# Patient Record
Sex: Female | Born: 1955 | Race: Black or African American | Hispanic: No | Marital: Single | Smoking: Former smoker
Health system: Southern US, Community
[De-identification: ages and names within clinical notes are randomized; demographics above are authoritative.]

## PROBLEM LIST (undated history)

## (undated) DIAGNOSIS — D649 Anemia, unspecified: Secondary | ICD-10-CM

## (undated) DIAGNOSIS — I10 Essential (primary) hypertension: Secondary | ICD-10-CM

## (undated) DIAGNOSIS — E785 Hyperlipidemia, unspecified: Secondary | ICD-10-CM

## (undated) HISTORY — DX: Anemia, unspecified: D64.9

## (undated) HISTORY — PX: APPENDECTOMY: SHX54

## (undated) HISTORY — DX: Hyperlipidemia, unspecified: E78.5

## (undated) HISTORY — DX: Essential (primary) hypertension: I10

---

## 1997-10-02 ENCOUNTER — Ambulatory Visit (HOSPITAL_COMMUNITY): Admission: RE | Admit: 1997-10-02 | Discharge: 1997-10-02 | Payer: Self-pay | Admitting: Obstetrics and Gynecology

## 1997-10-18 ENCOUNTER — Ambulatory Visit: Admission: RE | Admit: 1997-10-18 | Discharge: 1997-10-18 | Payer: Self-pay | Admitting: Obstetrics and Gynecology

## 1998-12-10 ENCOUNTER — Other Ambulatory Visit: Admission: RE | Admit: 1998-12-10 | Discharge: 1998-12-10 | Payer: Self-pay | Admitting: Obstetrics and Gynecology

## 2000-11-22 ENCOUNTER — Other Ambulatory Visit: Admission: RE | Admit: 2000-11-22 | Discharge: 2000-11-22 | Payer: Self-pay | Admitting: Obstetrics and Gynecology

## 2001-02-02 ENCOUNTER — Encounter (INDEPENDENT_AMBULATORY_CARE_PROVIDER_SITE_OTHER): Payer: Self-pay | Admitting: *Deleted

## 2001-02-02 ENCOUNTER — Ambulatory Visit (HOSPITAL_BASED_OUTPATIENT_CLINIC_OR_DEPARTMENT_OTHER): Admission: RE | Admit: 2001-02-02 | Discharge: 2001-02-02 | Payer: Self-pay | Admitting: Surgery

## 2003-01-31 ENCOUNTER — Other Ambulatory Visit: Admission: RE | Admit: 2003-01-31 | Discharge: 2003-01-31 | Payer: Self-pay | Admitting: Internal Medicine

## 2003-12-10 ENCOUNTER — Other Ambulatory Visit: Admission: RE | Admit: 2003-12-10 | Discharge: 2003-12-10 | Payer: Self-pay | Admitting: Family Medicine

## 2005-03-22 ENCOUNTER — Other Ambulatory Visit: Admission: RE | Admit: 2005-03-22 | Discharge: 2005-03-22 | Payer: Self-pay | Admitting: Internal Medicine

## 2006-04-21 ENCOUNTER — Other Ambulatory Visit: Admission: RE | Admit: 2006-04-21 | Discharge: 2006-04-21 | Payer: Self-pay | Admitting: *Deleted

## 2007-04-10 ENCOUNTER — Other Ambulatory Visit: Admission: RE | Admit: 2007-04-10 | Discharge: 2007-04-10 | Payer: Self-pay | Admitting: Family Medicine

## 2007-10-03 ENCOUNTER — Encounter: Admission: RE | Admit: 2007-10-03 | Discharge: 2007-10-03 | Payer: Self-pay | Admitting: Family Medicine

## 2007-10-19 ENCOUNTER — Inpatient Hospital Stay (HOSPITAL_COMMUNITY): Admission: RE | Admit: 2007-10-19 | Discharge: 2007-10-22 | Payer: Self-pay | Admitting: Obstetrics and Gynecology

## 2007-10-19 ENCOUNTER — Encounter (INDEPENDENT_AMBULATORY_CARE_PROVIDER_SITE_OTHER): Payer: Self-pay | Admitting: Obstetrics and Gynecology

## 2007-11-08 ENCOUNTER — Inpatient Hospital Stay (HOSPITAL_COMMUNITY): Admission: EM | Admit: 2007-11-08 | Discharge: 2008-01-24 | Payer: Self-pay | Admitting: Emergency Medicine

## 2007-11-08 ENCOUNTER — Encounter (INDEPENDENT_AMBULATORY_CARE_PROVIDER_SITE_OTHER): Payer: Self-pay | Admitting: General Surgery

## 2007-11-08 ENCOUNTER — Ambulatory Visit: Payer: Self-pay | Admitting: Internal Medicine

## 2007-11-09 ENCOUNTER — Encounter: Payer: Self-pay | Admitting: Family Medicine

## 2007-11-25 ENCOUNTER — Ambulatory Visit: Payer: Self-pay | Admitting: *Deleted

## 2007-11-25 ENCOUNTER — Encounter (INDEPENDENT_AMBULATORY_CARE_PROVIDER_SITE_OTHER): Payer: Self-pay | Admitting: Internal Medicine

## 2007-12-11 ENCOUNTER — Ambulatory Visit: Payer: Self-pay | Admitting: Infectious Diseases

## 2008-02-20 ENCOUNTER — Encounter: Admission: RE | Admit: 2008-02-20 | Discharge: 2008-02-20 | Payer: Self-pay | Admitting: General Surgery

## 2008-06-13 ENCOUNTER — Other Ambulatory Visit: Admission: RE | Admit: 2008-06-13 | Discharge: 2008-06-13 | Payer: Self-pay | Admitting: Family Medicine

## 2008-12-15 IMAGING — CT CT PELVIS W/ CM
2 of 5 series · 13 of 32 positions shown, 18 images · IV contrast (water/omni  & 100 ML OMNI 300)
Comparison: CT 12/05/2007

CT ABDOMEN

CLINICAL DATA: Abdominal sepsis.  Evaluate for right lower
quadrant fistula.  The history of exploratory laparotomy for bowel
perforation and appendix/cecum.  Status post desmoid removal.  Low
abdominal pain..

CT ABDOMEN AND PELVIS WITH CONTRAST
TECHNIQUE: Multidetector CT imaging of the abdomen and pelvis was
performed using the standard protocol following bolus
administration of intravenous contrast.
Contrast: 100 ml 9mnipaque-1DD IV.  Oral contrast media.

[Series 2: routine abdomen · axial · 0.98mm/px · z∈[-403,-23]mm · 6 of 108 slices shown, 11 images]
[im 16/108  soft-tissue]
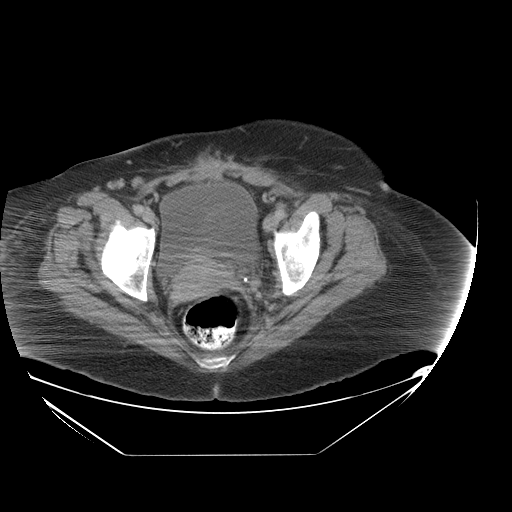
[im 16/108  bone]
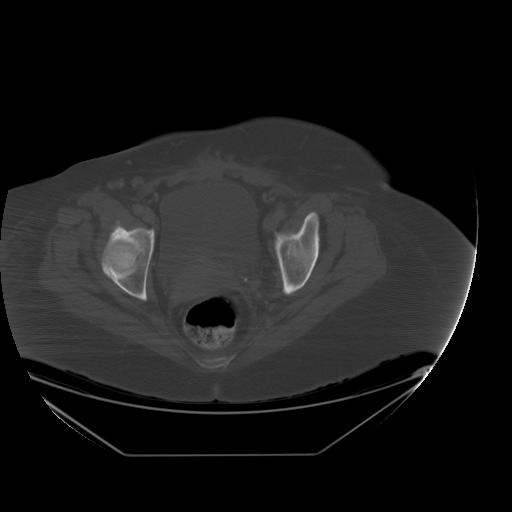
[im 31/108  soft-tissue]
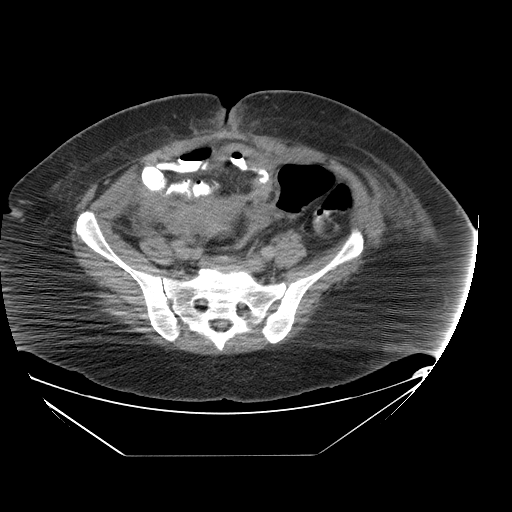
[im 46/108  soft-tissue]
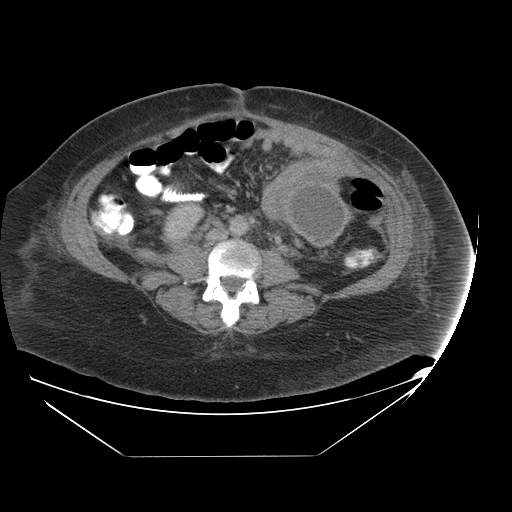
[im 46/108  lung]
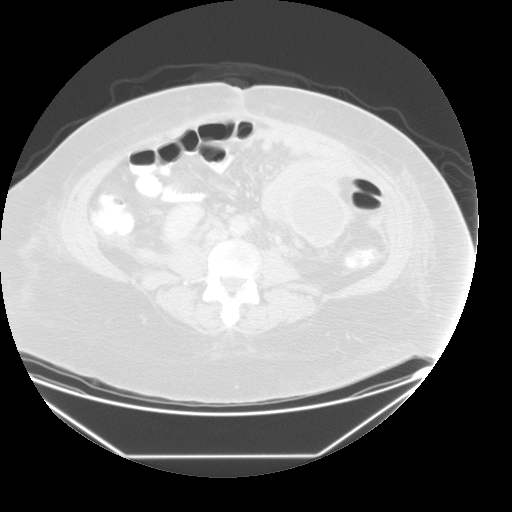
[im 62/108  soft-tissue]
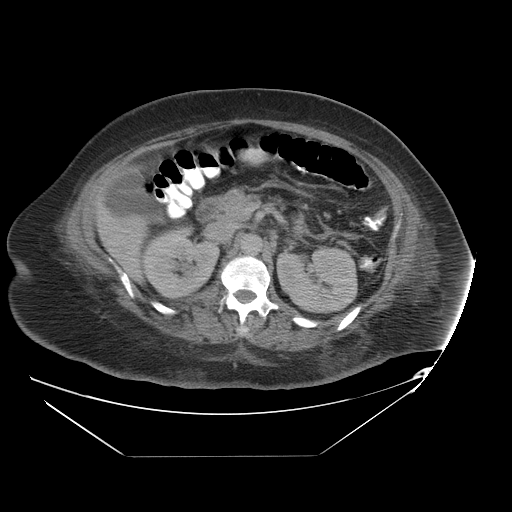
[im 62/108  lung]
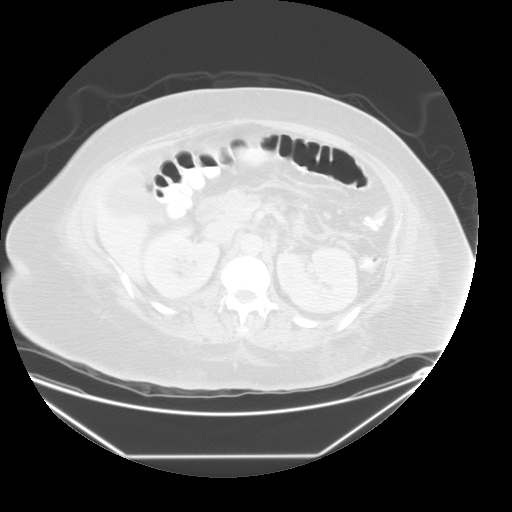
[im 77/108  soft-tissue]
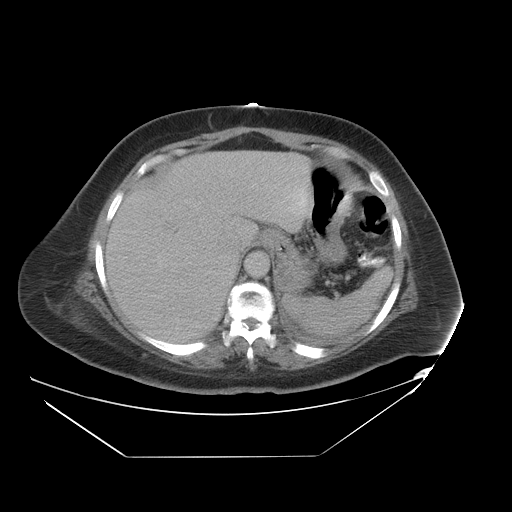
[im 77/108  lung]
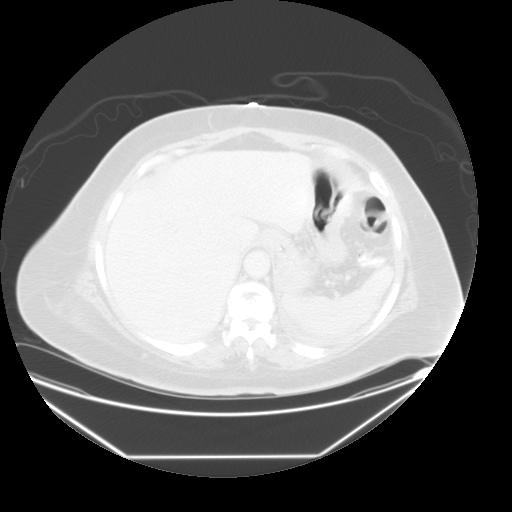
[im 92/108  soft-tissue]
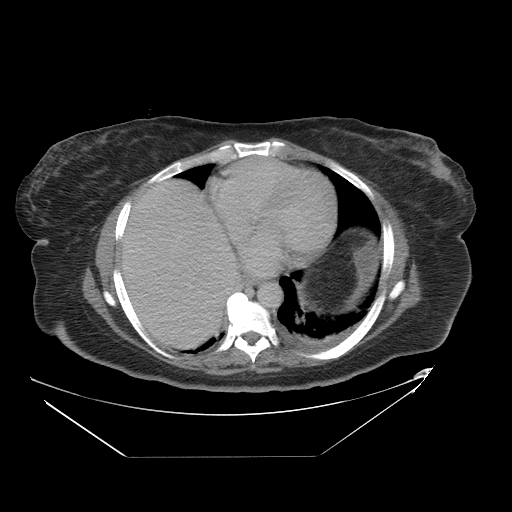
[im 92/108  lung]
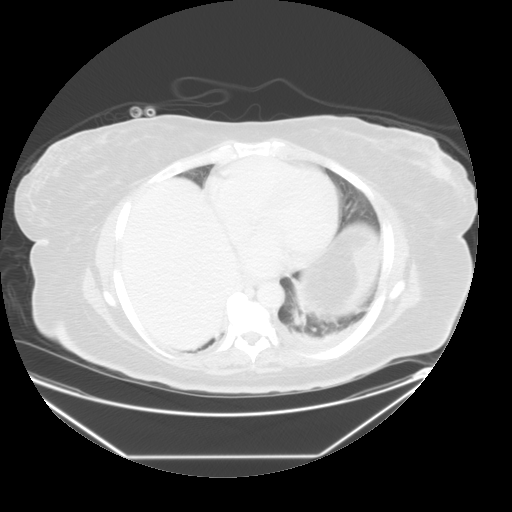

[Series 400: reformatted · sagittal · 0.90mm/px · 7 of 141 slices shown]
[im 15/141  soft-tissue]
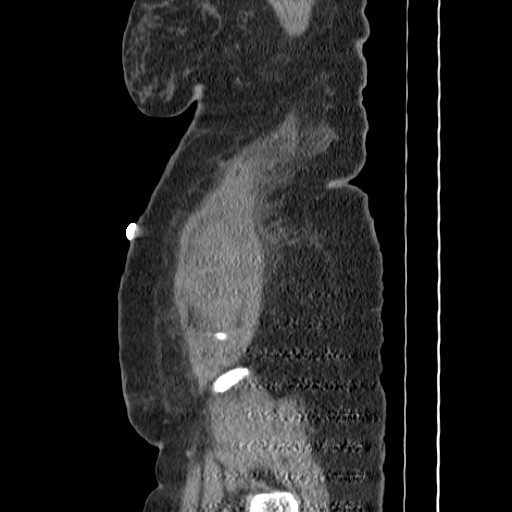
[im 29/141  soft-tissue]
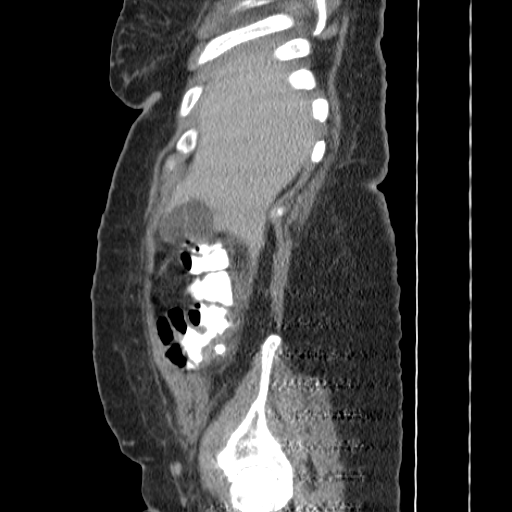
[im 43/141  soft-tissue]
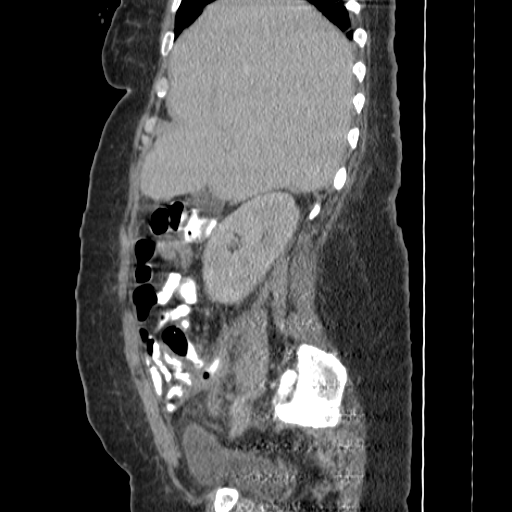
[im 57/141  soft-tissue]
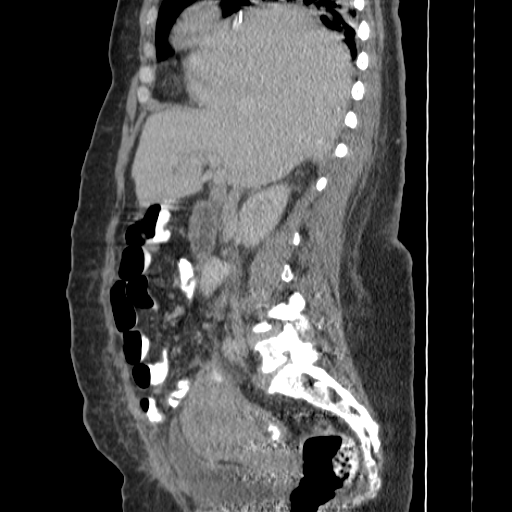
[im 85/141  soft-tissue]
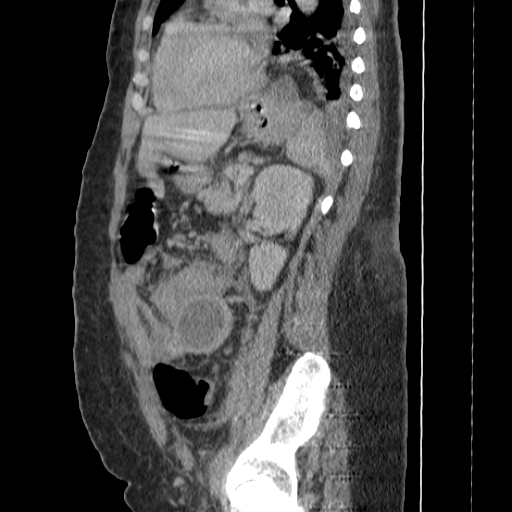
[im 99/141  soft-tissue]
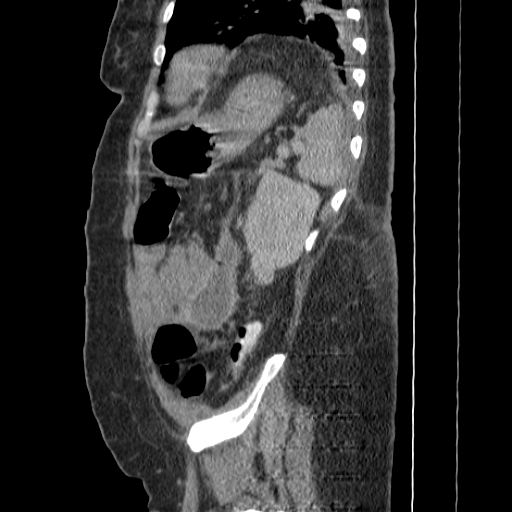
[im 113/141  soft-tissue]
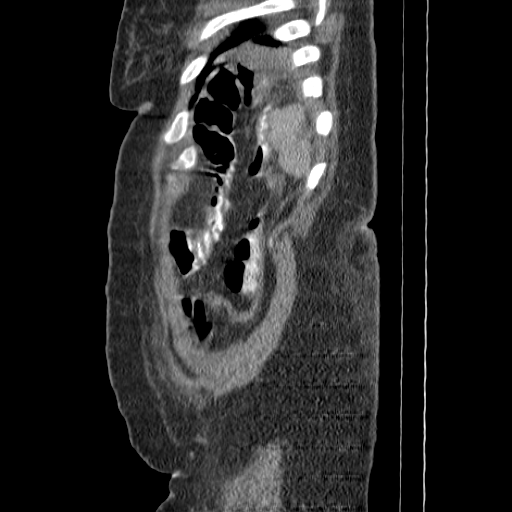

[13 of 32 positions shown; findings below may reference images not displayed]

FINDINGS: Atelectatic densities at the posterior lung bases.
Small left pleural effusion.  Unremarkable appearance of the liver,
spleen, pancreas.  Nephrostomy tube on the right as been removed
since prior exam.  Minimal dilatation of the collecting systems.
Reactive soft tissue thickening right posterior pararenal space
appears less impressive now.  Posterior abdominal catheter on the
right to extends into the a minimal residual fluid collection with
the soft tissue density appearing represent the coapted walls of
the previously noted collection.  There is some contrast material
within the collection extending from the region of the base of the
cecum.  There are is a thick-walled fluid collection/mass in the
left lower quadrant (images 55 - 70).  The collection measures
x 6.8 cm.  It is noted within the peritoneal cavity of the left
lower quadrant.
IMPRESSION: Decrease in soft tissues thickening of the right posterior
pararenal space.  Decrease in size of right posterior abdominal
fluid collection.  There is a fluid collection in the left lower
quadrant peritoneal cavity.  It is smaller and more well-defined
now..

CT PELVIS
FINDINGS: There is a 3.3-7.6 cm fluid collection containing
contrast material in the lower true pelvis.  It is located just
posterior the uterus and anterior to the rectum.  The collection is
smaller than noted on the prior exam.
IMPRESSION: Marked decrease in size of previously noted fluid
collection/hematoma in the pelvis.  The collection is smaller now
and contains contrast material.

## 2008-12-17 IMAGING — CR DG CHEST 1V PORT
1 series · 1 of 1 positions shown · non-contrast
Comparison: 12/19/2007

CLINICAL DATA: PICC placement.

PORTABLE CHEST - 1 VIEW

[view not recorded]
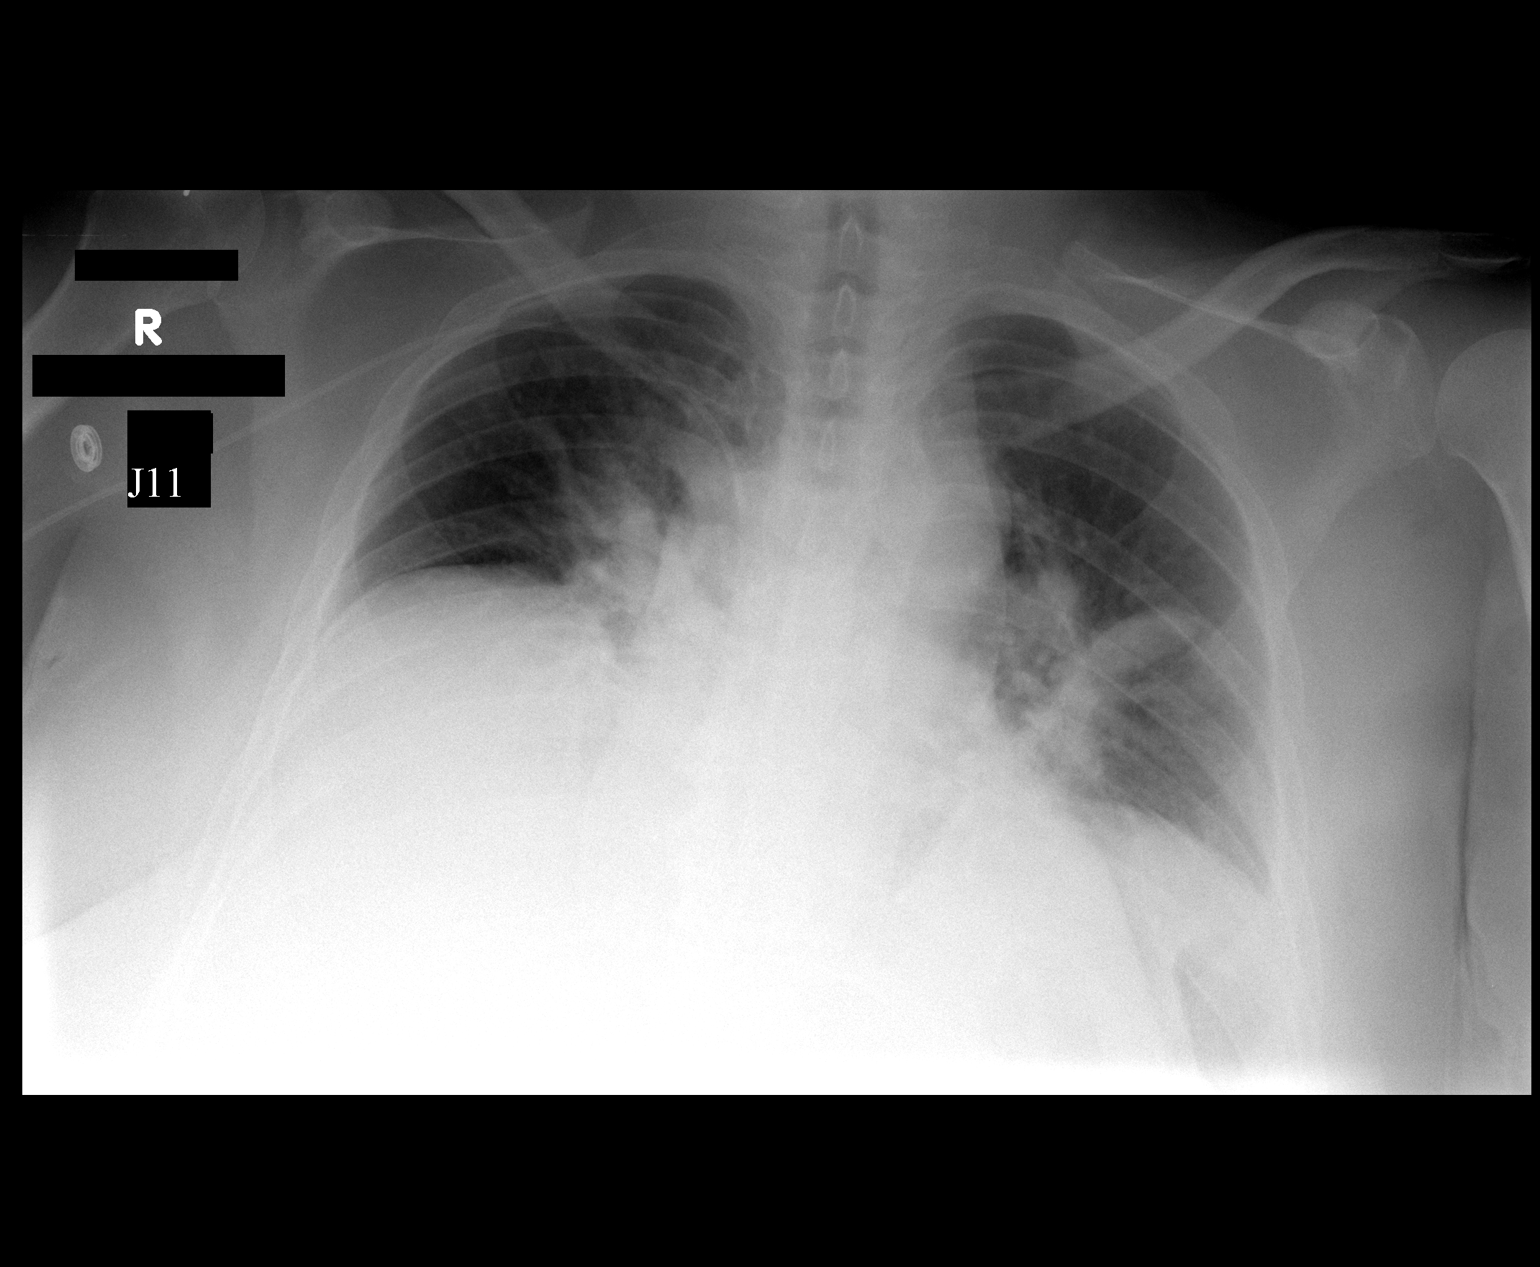

[1 of 1 positions shown; findings below may reference images not displayed]

FINDINGS: Right PICC tip projects over the lower SVC or SVC/RA
junction.  Lungs are very low in volume with perihilar and
bibasilar atelectasis.
IMPRESSION: Perihilar and bibasilar atelectasis.

## 2010-07-28 NOTE — Op Note (Signed)
[name], [name]                 ACCOUNT NO.:  [id number]   MEDICAL RECORD NO.:  [id number]          PATIENT TYPE:  INP   LOCATION:  2105                         FACILITY:  [facility]   PHYSICIAN:  Ollen Gross. Vernell Morgans, M.D. DATE OF BIRTH:  [birth year]   DATE OF PROCEDURE:  11/08/2007  DATE OF DISCHARGE:  11/07/2007                               OPERATIVE REPORT   PREOPERATIVE DIAGNOSES:  1. Free intra-abdominal air.  2. Bowel perforation.  3. Sepsis.   POSTOPERATIVE DIAGNOSES:  1. Free intra-abdominal air.  2. Bowel perforation.  3. Sepsis.  4. Perforated appendicitis.   PROCEDURES:  1. Exploratory laparotomy.  2. Lysis of adhesions.  3. Drainage of intra-abdominal abscesses.  4. Appendectomy.   SURGEON:  Ollen Gross. Vernell Morgans, MD   ASSISTANT:  Almond Lint, MD   ANESTHESIA:  General endotracheal.   PROCEDURE:  After informed consent was obtained, the patient was brought  to the operating and placed in a supine position on the operating table.  After adequate induction of general anesthesia, the patient's abdomen  was prepped with Betadine and draped in the usual sterile manner.  The  patient had undergone an open resection of a dermoid cyst from her right  ovary about 3 weeks ago.  She initially did well, but over the last  week, she has had increasing abdominal pain and CT scan performed today  showed free intra-abdominal air consistent with a bowel perforation.  A  midline incision was made with a #10 blade knife.  This incision was  carried down through the skin and subcutaneous tissue sharply with the  electrocautery until the linea alba was identified.  The linea alba was  incised.  Also with the electrocautery, the preperitoneal space was  probed bluntly with the hemostat until the peritoneum was opened.  Access was gained to the abdominal cavity.  The rest of the incision was  then opened under direct vision.  Upon entering the abdominal cavity,  the patient had a  significant amount of pus in the abdominal cavity.  A  lot of this was evacuated and cultures were taken.  The incision had to  be extended superiorly in order for [place] to be able to be visualize the  contents of the abdomen.  There were a lot of interloop abscesses and  loculations that were all broken up by blunt finger dissection.  The  small bowel was then freed up proximally and distally, although there  was inflammation and purulence throughout the abdomen, the most dense  inflammatory process was in the right lower quadrant around the  appendix.  The appendix was able to be identified and did appear to be  inflamed, although it was not clear given the amount of inflammation  throughout the abdomen whether this was the primary source.  The worse  density of the adhesions and inflammation appeared to be centered around  the appendix.  The right colon was mobilized by incising some of its  retroperitoneal attachment along the white line of Toldt.  This allowed  [place] to roll the cecum medially and  up into the wound.  Once we were able  to do this, we were able to break up some adhesions around the appendix  by finger dissection.  Near the base, the appendix was then opened  bluntly with a right-angle clamp.  A GIA-55 stapler was then placed  across the appendix near its base at this point, clamped, and fired  thereby dividing the base of  the appendix between staple lines.  The  rest of the mesoappendix was taken down sharply with the electrocautery.  A small bleeding vessel was controlled with the figure-of-eight 2-0 silk  stitch.  The colon was also examined.  The rectosigmoid area was  inflamed but seemed to be secondarily inflamed to the rest of the  process.  No other points of perforation were identified.  The abdomen  was then irrigated with copious amounts of saline.  A stab incision was  made in the right lateral abdominal wall with a #15 blade knife.  A  tonsil clamp was placed  through this opening into the abdominal cavity  and a 19-French round Blake drain was brought through this opening.  The  drain was laid along the right gutter and into the pelvis.  The inferior  portion of the incision was very dense with inflammatory change from the  timing of her old surgery and it was felt that at this point closing her  primarily might put her at risk for dehiscence.  Because of this, we  then closed her abdomen with 2 running #1 double-stranded loop PDS  sutures as well as four #5 Ethibond retention sutures.  Once the  abdominal cavity was closed and retention bridges were set, the wound  was then packed with moistened Kerlix and sterile dressings were  applied.  The patient tolerated the procedure well.  At the end of the  case, all needle, sponge, and instrument counts were correct.  The  patient was then taken back to the ICU and intubated for further  resuscitation measures.      Ollen Gross. Vernell Morgans, M.D.  Electronically Signed     PST/MEDQ  D:  11/09/2007  T:  11/09/2007  Job:  [id number]

## 2010-07-28 NOTE — Consult Note (Signed)
NAMELACHE, [name]                 ACCOUNT NO.:  [id number]   MEDICAL RECORD NO.:  [id number]          PATIENT TYPE:  INP   LOCATION:  2915                         FACILITY:  [facility]   PHYSICIAN:  Bertram Millard. Dahlstedt, M.D.DATE OF BIRTH:  [birth year]   DATE OF CONSULTATION:  12/19/2007  DATE OF DISCHARGE:                                 CONSULTATION   REASON FOR CONSULTATION:  History of right hydronephrosis.   BRIEF HISTORY:  This is a 55 year old female who was admitted in August  2009 and has been in the hospital for severe complications secondary to  perforated appendicitis.  She had intraabdominal sepsis.  She had a  percutaneous nephrostomy tube placed on approximately November 25, 2007, secondary to sepsis and mild hydronephrosis.  At the time of  percutaneous nephrostomy tube placement, antegrade nephrostogram was  performed showing her ureter patent all the way to the bladder.  At that  time, she grew out negative urine culture.  She had percutaneous  nephrostomy tube replaced secondary to poor drainage approximately 6  days later.  At that time, her ureter was patent as well on the  nephrostogram.   Urology has not been consulted for this patient until today.  The  question is whether she needs internalization of the stent or removal of  percutaneous nephrostomy tube.   I have had a chance to review the patient's CT scans during the  hospitalization as well as the approximately tube placement change.  There is very minimal hydronephrosis on an ultrasound done today of her  initial nephrostomy tube placement.  Her ureter is patent on  nephrostograms.   I spoke with the patient.  I feel it is worthwhile to plug her  nephrostomy tube tonight and over the next day or so perform a  nephrostogram.  If she develops pain in the kidney, I would recommend  rehooking the nephrostomy tube to drainage.  Otherwise, I will write an  order for x-ray to perform a nephrostogram an  eventual removal of  nephrostomy tube.      Bertram Millard. Dahlstedt, M.D.  Electronically Signed     SMD/MEDQ  D:  12/19/2007  T:  12/20/2007  Job:  [id number]

## 2010-07-28 NOTE — Consult Note (Signed)
NAMEJUNIE, AVILLA                 ACCOUNT NO.:  [id number]   MEDICAL RECORD NO.:  [id number]          PATIENT TYPE:  INP   LOCATION:  2105                         FACILITY:  [facility]   PHYSICIAN:  Gustavus Messing. Orlin Hilding, M.D.DATE OF BIRTH:  [birth year]   DATE OF CONSULTATION:  11/14/2007  DATE OF DISCHARGE:                                 CONSULTATION   REASON FOR CONSULTATION:  Pupillary asymmetry and abnormal CT,  questionable cerebral edema.   HISTORY OF PRESENT ILLNESS:  [name] is 55 year old African American  woman who was admitted on November 08, 2007, with abdominal pain and  mental status changes, which turned out to be due to shock from ruptured  viscus.  She went to surgery emergently and was found to have a ruptured  appendix and sepsis.  She underwent emergency surgery, but has had  multisystem failure.  Since then requiring continued ventilator support  and continued dialysis.  Mental status changes were described at  admission, blood pressures has been stable.  Neuro exam is not well,  described except today, the pupils were active and that she had exam  consistent with toxic metabolic encephalopathy.  On one occasion, she  was mentioned to be using all of her extremities.  She waxed and waned  since admission and then on November 12, 2007, continued to deteriorate  with low blood pressure and low hemoglobin.  Early this morning, she was  noted to have pupillary asymmetry and pupils were poorly reactive.  CT  scan of brain was obtained, which was interpreted showing some cerebral  edema.  Neurology was called.   The review of systems is not obtainable at this time as the patient is  intubated and comatose.  However, on admission she had back pain that  she described as dull.  She had had a recent surgery for ovarian cyst  just last month.  She does have chronic back pain as well.  On  admission, she had no neurological complaints.  She uses contact lenses,  otherwise she  has had a fairly unremarkable exam and history.  Her past  medical history significant for the ovarian dermoid cyst, which was  removed in the right ovary and 2 to 3 weeks ago, she has had excision of  the left breast mass presumably benign, two C-sections, and then an  exploratory laparoscopy for perforated appendix during this  hospitalization.   Her current medications are caspofungin, Xigris, heparin, Primaxin,  NovoLog, Versed, Protonix, Zosyn, and vancomycin.  She got fresh frozen  plasma and some packed red blood cells.  She has fentanyl drip.  She has  required Neo-Synephrine and Levophed, PrismaSate and sodium chloride.   ALLERGIES:  No known drug allergies.   SOCIAL HISTORY:  She is not married.  She has one son.  There is a  sister who is here at the bedside as well as niece and family friends.  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  No  tobacco, alcohol, or recreational drug use.   FAMILY HISTORY:  Noncontributory and unobtainable  at any rate.   OBJECTIVE:  On exam blood pressure currently 85/49 on pressor, heart  rates is 90.  Head is normocephalic and atraumatic.  Mental status:  There is no response to verbal stimulus.  She will open her eyes to  physical stimulus.  She also grimace to sternal rub, supraorbital  pressure, and nailbed pressure in the right upper extremity and both  lower extremities, but no response to left upper extremity.  Cranial  nerves:  She has a slightly conjugate gaze with pupils asymmetric right  at about 3 mm, nonreactive left 2 mm and nonreactive.  There is no blink  to threat.  She does have positive corneals bilaterally and able to  drive eyes to either side of midline with oculocephalic maneuver.  She  is breathing over the ventilator and does gag on suction.  Motor exam:  She has flaccid tone, no movement to pain, but does grimace to pain in  bilateral lower extremities and right upper extremity.  The CT  shows  question of mild effacement of the temporal sulci with normal sulci over  the convexities and actually dilated parietal sulci.  Normal ventricles  are perhaps too small for 55 and the basilar cisterns are normal.   LABS:  CBC shows a white blood cell count of 56.6, hemoglobin of 6.9,  hematocrit 19.3, platelets 243.  Sodium 137, potassium 3.9, chloride 95,  CO2 28, glucose 145, BUN 50, creatinine 6.89.  SGOT and SGPT are  elevated.  Calcium is 7.0 to the low.   IMPRESSION:  Mixed encephalopathy with hypovolemic shock, sepsis,  multiorgan failure.  CT scan is not that impressive for edema to me with  basilar cisterns and most cortical sulci preserved.  There is no  indication of herniation.  However, exam is concerning for midbrain  level dysfunction, whether ischemic or secondary to mass effect or  herniation is unclear.   RECOMMENDATIONS:  I see no role for pharmacologic intervention such as  mannitol or Decadron as there is no mass and nothing could be resected  that could help reverse this process.  I will follow her over time.  Recheck CT of the brain this evening.  Check an EEG.  Under the  circumstances, the prognosis is poor although the patient certainly is  not brain dead.  I discussed this with the patient's family.      Catherine A. Orlin Hilding, M.D.  Electronically Signed     CAW/MEDQ  D:  11/14/2007  T:  11/15/2007  Job:  [id number]

## 2010-07-28 NOTE — Procedures (Signed)
EEG NUMBER:  [id number].   This is a 55 year old woman who is suffering from hypovolemic shock and  sepsis due to perforated appendix.  The patient has had multisystem  failure including severe oliguric renal failure and ventilator-dependent  respiratory failure.  She was noted to have pupillary asymmetry with  worsening of her mental status.   MEDICATIONS:  Protonix, insulin, Primaxin, Versed, morphine, Levophed,  Normodyne, and vancomycin.   This was a portable 17-channel EEG, with one channel devoted to EKG,  utilizing the [employer] 10/20 lead placement system.  The patient  was described clinically as being unresponsive electrographically.  Her  conscious state is not well identified.  Photic stimulation was  performed.  Hyperventilation was not performed.  Background consists of  an admixture of theta, delta, and alpha activity with 5-7 Hz beta  predominating; occasional bouts of 8-10 Hz alpha are seen, but there is  never a well-organized, well-developed, normal-appearing alpha  background.  Occasional slowing to the delta range is seen and  occasional isolated delta waves are identified without clear  periodicity.  Occasional small sharp waves were also seen with shifting  focus.  No clear interhemispheric asymmetry is identified and no  definite epileptiform discharges are seen other than small sharp waves  described above.  Photic stimulation did not produce any significant  change in the background activity.  Hyperventilation was not performed.  The EKG monitor reveals a relatively regular rhythm with a rate of 90  beats per minute.   CONCLUSION:  Abnormal EEG demonstrating generalized slowing.  No  definite seizure activity or focal abnormalities are seen during the  course of today's recording.  This is certainly not a study consistent  with brain death but does indicate a moderately severe, diffuse neuronal  dysfunction that may be seen in toxic, metabolic, anoxic  encephalopathies including hypovolemic states and sepsis.  Clinical  correlation is recommended.      Catherine A. Orlin Hilding, M.D.  Electronically Signed     [id number]  D:  11/14/2007 18:46:02  T:  11/15/2007 07:35:23  Job #:  [id number]

## 2010-07-28 NOTE — Discharge Summary (Signed)
NAME:  [name], [name]                 ACCOUNT NO.:  [id number]   MEDICAL RECORD NO.:  [id number]          PATIENT TYPE:  INP   LOCATION:  5152                         FACILITY:  [facility]   PHYSICIAN:  Ollen Gross. Vernell Morgans, M.D. DATE OF BIRTH:  1955/04/14   DATE OF ADMISSION:  11/07/2007  DATE OF DISCHARGE:  01/24/2008                               DISCHARGE SUMMARY   ADMITTING PHYSICIAN:  Ollen Gross. Vernell Morgans, MD   DISCHARGING PHYSICIAN:  Dr. Freida Busman.   CONSULTANTS:  1. Felipa Evener, MD, Critical Care Medicine, November 08, 2007.  2. Dyke Maes, MD, Nephrology, November 09, 2007.  3. Gustavus Messing. Orlin Hilding, MD, Neurology, November 14, 2007.  4. Lucky Cowboy, MD, Ears, Nose, and Throat on November 26, 2007.  5. Bertram Millard. Dahlstedt, MD, Urology, on December 19, 2007.  6. Interventional Radiology, November 25, 2007.  7. Lacretia Leigh. Ninetta Lights, MD, with Infectious Disease.   CHIEF COMPLAINT/REASON FOR ADMISSION:  Ms. Peppel is a 55 year old  African American female patient 3 weeks post resection of dermoid cyst,  right ovary, did well initially, but over the past week had increasing  abdominal pain and low-grade fevers at home.  This was associated with  nausea, vomiting, and decreased level of consciousness.  She was brought  to the ER, where she was found to be quite toxic in appearance.  Temperature was 99.2, BP 84/49, and pulse 101.  White count 22,900.  Creatinine of 5.2.  On exam, lungs were clear, not tachypneic.  Heart  was regular with tachycardia.  Abdomen was diffusely tender with  guarding and peritoneal signs, well-healed midline abdominal incision.  CT scan of the abdomen was done, revealed significant amount of free  abdominal air and free anterior abdominal fluid consistent with the  bowel preparation.   The patient was admitted with following diagnoses:  1. Free air and free fluid secondary to perforated viscus in      postoperative patient.  2. Acute renal failure.  3.  Sepsis and shock with associated hypokalemia.   PROCEDURES ON THIS ADMISSION:  1. Exploratory laparotomy with lysis of adhesions, drainage of      intraabdominal abscesses, and incidental appendectomy by Dr. Carolynne Edouard      on November 08, 2007, for free air, bowel perforation, sepsis, and      associated perforated appendix.  2. Right IJ hemodialysis catheter by Interventional Radiology on      November 24, 2007.  3. Right percutaneous nephrostomy tube on November 25, 2007, by      Interventional Radiology for hydronephrosis.  4. Left thoracentesis by Interventional Radiology for large pleural      effusion.  5. Tracheostomy tube by Dr. Lucky Cowboy on November 27, 2007, for      persistent ventilator-dependent respiratory failure.  6. Right lower quadrant drain on November 29, 2007, for      intraabdominal abscess.  7. Left lower quadrant drain on January 03, 2008, for left lower      quadrant abscess.   HOSPITAL COURSE:  The patient was admitted, taken to the OR, where  she  underwent the above-described procedures for perforated viscus and free  air.  She was septic with associated shock and acute renal failure, and  was therefore admitted to the Critical Care Unit in the postoperative  period.  Consultations were obtained with Dr. Molli Knock of Critical Care  Medicine and Dr. Briant Cedar.  The patient was hemodynamically unstable  and required hemodynamic support with vasopressive agents and multiple  fluid boluses, and eventually Xigris was utilized to treat her sepsis.  Later, she developed significant intraperitoneal and retroperitoneal  bleeding that caused acute blood loss anemia, further worsening her  shock, and multisystem organ failure and required multiple blood  transfusions.  She also remained on the ventilator for an extended  period of time, therefore requiring tracheostomy tube.  During this time  period in the initial 30-40 days postoperatively, she did develop  significant  leukocytosis that was solely attributed to a positive  culture of Syntrophomonas found on bronchioalveolar lavage on November 23, 2007, recurrent.  Serial CTs eventually showed not only the  abdominal bleeding but abscess formation, especially in the right lower  quadrant, percutaneous drain placed per Interventional Radiology.  Cultures were positive for ampicillin-resistant E. coli.  This drain  remained in place until just prior to discharge.  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   By December 08, 2007, the patient was stable enough to be transferred  to a step-down unit.  She was slowly weaned to a trach collar.  Speech  Therapy was consulted.  PMV training was initiated, and the patient was  able to be decannulated by December 26, 2007.   Because of the right fecal fistula, the patient's oral/enteral diet had  been held for an extensive period during the initial portion of the  hospitalization, this required parenteral nutrition, and by December 19, 2007, Speech Therapy gave recommendations on initiating oral diet.  Her  diet was slowly advanced to a low-residue diet, which she has been  tolerating at the time of discharge.   In regards to her wound, she has had an open wound, which initially had  retention sutures in it as well as packing.  The wound remained clean  without any evidence of infection, and by postoperative day #40, the  retention sutures were removed without difficulty, and by the time of  discharge, the patient's wound had completely healed, and it had  epithelialized by 100%.  By hospital day #48, the patient was deemed  appropriate to transfer to the general surgical floor.  At this point,  Inpatient Rehab was consulted in regards to evaluating the patient for  possible discharge to their facility, but because she had skilled needs  regarding IV antibiotic as well as right lower quadrant drain and the IV   parenteral nutrition, which was supplementing the patient's oral diet  which at this time was not adequate enough to meet nutritional needs,  she was turned down by rehab, and plans were to potentially send the  patient home with home health and send home with drain.   The remainder of hospitalization, we continued to work with the  patient's conditioning.  She had difficulty related to sepsis and  leukocytosis.  PICC lines were changed, cultures were obtained, these  all had no growth.  A followup CT on postop day 48 did reveal that the  right lower quadrant abscess was resolving but, again, she had a  fistula.  She  was also developing fluid collections in the left lower  quadrant measuring 5.8 x 6.8.  She subsequently underwent percutaneous  drain placement in the left lower quadrant.  Abscesses did reveal  multidrug-resistant E. coli which was treated effectively with Invanz.  During the same time period, Interventional Radiology was working to  pull back the patient's right lower quadrant drain because it had been  migrating into the right colon, and this drain was subsequently  discontinued on postop day #68.  On postop day #66, the left lower  quadrant drain had been discontinued.  Over the next several days, the  patient's white count continued to trend down, but was still elevated,  so the PICC line was discontinued.  Once the patient's cultures returned  back positive for the multidrug-resistant E. coli, I need to clarify  that the Invanz she had been placed on was changed over to Primaxin.  The patient had no pain, although she was having increased flatus and  diarrhea symptoms.  Tolerating her diet.  Her white count had picked up  at 15,500 on postop day 71, and a repeat CT scan showed no recurrence of  abscess formation after right lower quadrant drain had been removed.  The PICC line was removed, white count continued to trend down, and plan  was stop antibiotics and if white  count remained down discharge the  patient home.  On the day of the discharge, January 24, 2008,  postoperative day #76, the patient's white count was 6500, her abdominal  exam was benign, off antibiotics, she was tolerating low-residue diet,  and ready for discharge home.   I neglected to mention during the acute phase, when she was critically  ill, the patient had acute renal failure, which did require short-term  hemodialysis for about 1 month while she was in the step-down unit, post  which the dialysis catheter was removed.  Also, early in the  hospitalization on November 14, 2007, the patient had altered mental  status.  She was evaluated by Neurology, and was found have a mixed  encephalopathy secondary to hypovolemic shock, sepsis, and multisystem  organ failure, this has all cleared since discharge.  In regards to the  patient's deconditioning, the patient was started on aggressive PT/OT  regimen once alert and able to get out of bed to the chair.  At the time  of discharge, she is mostly ambulating unassisted, although she uses a  walker when she feels tired and to help with repositioning and for  prolonged standing such as going up stairs or at the sink.   Cultures this admission, bronchoalveolar lavage on November 23, 2007,  showed 25,000 colonies of Stenotrophomonas maltophilia.  Right lower  quadrant drain culture from November 29, 2007, ampicillin-resistant E.  coli; left lower quadrant culture drained on January 03, 2008, multidrug-  resistant E. coli.  Otherwise, multiple blood cultures and urine  cultures etc. has been negative.  C. diff has been negative.  Additional  pelvic fluid culture from operation on November 08, 2007, showed few  Prevotella bivia.   Antibiotics, antimicrobials, etc., used at this admission include  caspofungin, Diflucan, Ceftin, vancomycin, Flagyl, Invanz, and Primaxin.   FINAL DISCHARGE DIAGNOSES:  1. Abdominal pain and acute abdomen  secondary to free air and      perforated bowel, status post exploratory laparotomy, lysis of      adhesions, drainage of intraabdominal abscess with an appendectomy      per Dr. Carolynne Edouard on November 08, 2007.  2. Sepsis and shock, with use of Xigris therapy on this admission.  3. Acute renal failure requiring hemodialysis, resolved.  4. 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  5. Ventilator-associated pneumonia as noted.  6. Acute blood loss anemia secondary to associated retroperitoneal and      intraabdominal bleeding, associated abscess.  7. Persistent right fecal fistula, status post right lower quadrant      drain, fistula sustained, with no evidence of residual abscess.  8. Persistent leukocytosis, multifactorial, resolved.  9. Pseudohyperkalemia secondary to profound leukocytosis/with residual      hypokalemia.  10.Hypertension, new meds added this admission.  11.Deconditioning, improved.  12.Protein-calorie malnutrition requiring parenteral nutrition this      admission, low-residue diet at discharge.  Last free albumin      recently checked this week was 11.2.  13.Right hydronephrosis requiring right percutaneous nephrostomy tube      this admission, discontinued.  14.Left pleural effusion requiring thoracentesis.  15.Posterior scalp stage II decubitus, resolved, with residual      alopecia.   DISCHARGE MEDICATIONS:  1. The patient was on lisinopril/hydrochlorothiazide 20/12.5 twice a      day prior to admission; this has been placed on hold due to      patient's hypokalemia.  2. She will resume her Vicodin she was taking at home if she needs it.   NEW MEDICATIONS:  1. Baby aspirin daily 81 mg.  2. Lisinopril 40 mg daily.  3. Metoprolol 100 mg b.i.d.  4. Over-the-counter Tylenol as needed for mild pain.  Use Vicodin for      more severe pain.  5. K-Dur 40 mEq daily.   Return to work to be determined by Dr. Carolynne Edouard.  The patient has been  given  an interim note to return to work 2 weeks from today's date.   DIET:  She is to remain on a low-residue diet until further instructed  by Dr. Carolynne Edouard.   WOUND CARE:  Not applicable.  Midline wound has healed.   ACTIVITY:  Increase activity slowly.  Use rolling walker as indicated.  May walk up steps.  May shower.  No lifting greater than 10 pounds for 2  weeks.  No driving for 1 week.  Home health RN, aide, physical therapy,  and OT as indicated, rolling walker at discharge.   OTHER INSTRUCTIONS:  Call the surgeon, if:  A.  She develops a fever greater than or equal to 101.5 degrees  Fahrenheit.  B.  New or increased belly pain.  C.  Nausea, vomiting, diarrhea, or inability to have a bowel movement.  D.  Change in abdominal wound in regards to redness or drainage.   FOLLOWUP APPOINTMENT:  She has an appointment to see Dr. Carolynne Edouard on  Thursday, February 01, 2008, at 10:40 a.m.  You are to arrive at 10:25  a.m. to complete paperwork.  Dr. Eliot Ford telephone number is [phone].      Allison L. Rennis Harding, N.POllen Gross. Vernell Morgans, M.D.  Electronically Signed    ALE/MEDQ  D:  01/24/2008  T:  01/25/2008  Job:  [id number]   cc:   Dyke Maes, M.D.  Lacretia Leigh. Ninetta Lights, M.D.  Bertram Millard. Dahlstedt, M.D.  P's Gynecologist s/l Dr. Pricilla Holm

## 2010-07-28 NOTE — Op Note (Signed)
NAME:  [name], [name]                 ACCOUNT NO.:  [id number]   MEDICAL RECORD NO.:  [id number]          PATIENT TYPE:  INP   LOCATION:  9306                          FACILITY:  [facility]   PHYSICIAN:  Charles A. 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   PROCEDURES:  1. Exploratory laparotomy.  2. Right salpingo-oophorectomy.   SURGEON:  Charles A. Sydnee Cabal, MD   ASSISTANT:  Gerald Leitz, MD   COMPLICATIONS:  None.   ESTIMATED BLOOD LOSS:  Less than 25 mL.   ANESTHESIA:  General by the endotracheal route.   INSTRUMENT SPONGE AND NEEDLE COUNT:  Correct x2.   FINDINGS:  Normal left ovary, right ovary encompassed entirely by 15 x  20 cm approximate dermoid cyst appeared to be mature with hair, skin,  bone, and viscous fluid upon opening after the case was completed.   SPECIMENS:  Right tube and ovary to pathology.   DESCRIPTION OF PROCEDURE:  The patient was taken to the operating room  and placed in supine position, general anesthetic was induced without  difficulty.  She was sterilely prepped and draped in usual standard  fashion and vertical skin incision was made with knife, carried down to  fascia.  Fascia was incised with the knife and Mayo scissors.  Rectus  muscles were sharply dissected in midline.  Peritoneum was entered with  Metzenbaum scissors without damage to surrounding structures.  The hand  was  was used to explore the abdomen.  There was noted to be right  ovarian dermoid cyst with some adhesions to the sigmoid colon.  These  were taken down sharply with good hemostasis and the ovary could be  lifted out through the incision without retractor.  After removing  further any bowel adherent and peritoneal edges were undertaken as  the  ovarian vessels were skeletonized.  The pedicle was cross-clamped  proximal to the ovary and free tied twice and then transfixion stitch  once with good hemostasis resulting small window.  Peritoneum was closed  with 2-0 Vicryl on the area of the mesosalpinx prior.  Hemostasis was  excellent.  The procedure was then terminated.  The fascia was closed en  bloc fashion with #1 PDS.  Hemostasis was excellent after minor electrocautery.  In the  subcutaneous layer, 2-0 plain gut was used to close the subcutaneous  layer.  A sterile skin clips were used to close the skin.  Sterile  dressing was applied.  The patient tolerated procedure well and was  taken to recovery with physician in attendance.      Charles A. Sydnee Cabal, MD  Electronically Signed     CAD/MEDQ  D:  10/19/2007  T:  10/20/2007  Job:  [id number]

## 2010-07-28 NOTE — Op Note (Signed)
NAME:  [name], [name]                 ACCOUNT NO.:  [id number]   MEDICAL RECORD NO.:  [id number]           PATIENT TYPE:   LOCATION:                                 FACILITY:   PHYSICIAN:  Lucky Cowboy, MD              DATE OF BIRTH:   DATE OF PROCEDURE:  11/27/2007  DATE OF DISCHARGE:                               OPERATIVE REPORT   PREOPERATIVE DIAGNOSIS:  Chronic ventilatory dependency.   POSTOPERATIVE DIAGNOSIS:  Chronic ventilatory dependency.   PROCEDURE:  Planned tracheotomy.   SURGEON:  Lucky Cowboy, MD   ANESTHESIA:  General endotracheal anesthesia.   ESTIMATED BLOOD LOSS:  Less than 20 mL.   SPECIMENS:  None.   COMPLICATIONS:  None.   INDICATIONS:  The patient is a 55 year old female with a very  complicated medical and hospital history.  This began, admitted in  August with pelvic surgery to remove a right ovarian cyst.  Subsequent  to this, she experienced sepsis secondary to ruptured appendix.  Exploration and drainage with the abdomen left with partial closure  using retention sutures November 08, 2007.  She developed retroperitoneal  hematoma, thought to be due to the antisepsis drug, Xigris.  The patient  also is suffering from renal insufficiency from shock and poor perfusion  and is requiring hemodialysis.  This patient is a gravely ill patient  with anticipated prolonged respiratory support and for this reason, a  bedside tracheotomy procedure was initially attempted by the critical  care doctor.  Surgical cut-down at the bedside was performed in  November 22, 2007, but aborted secondary to thumb size of pulsatile  mass.  Therefore, tracheotomy requested by Otolaryngology.  Risks were  discussed with the patient's mother as well as options and expectations.   FINDINGS:  The patient was noted to have a normal tracheal anatomy and  normal size thyroid without nodules.   PROCEDURE:  The patient was taken to the operating room and placed on  the table in the  supine position.  She was then placed under general  anesthesia by existing endotracheal tube and IV lines.  Neck was prepped  with Betadine and draped in the usual sterile fashion with the neck  extended.  Landmarks were marked with a marking pen.  A transverse  incision was made approximately 2.5 cm in the lower skin lines and then  a lower neck crease.  This elliptical incision site included the  preexisting skin puncture site from the prior initial attempt at  tracheotomy.  This was made using a #15 blade and subcutaneous fat  divided with Bovie cautery.  The strap muscles were divided in the  median raphe with Bovie cautery.  The thyroid gland was elevated off the  underlying trachea, doubly clamped and tied off with 2-0 silk suture.  The trachea was divided between rings 2 and 3 using #15 blade and 0 silk  suture used to place stay sutures around ring 2 and also ring 3 with 2  knots being placed around ring 2 and 1 knot being placed  around ring 1  for identification in case of dislodgement of the tracheotomy tube.  A  #8 tracheotomy tube, which was cuffed and made Shiley was placed through  the tracheotomy site and secured to the skin in simple  interrupted fashion at the 4 corners using 2-0 silk suture.  A Velcro  trach trial was a plate that was applied.  The patient was awakened from  anesthesia and taken to the Post Anesthesia Care Unit in stable  condition.  There were no complications.      Lucky Cowboy, MD  Electronically Signed     SJ/MEDQ  D:  05/23/2008  T:  05/23/2008  Job:  [id number]   cc:   [facility]

## 2010-07-28 NOTE — H&P (Signed)
NAMESALIMATA, CHRISTENSON                 ACCOUNT NO.:  [id number]   MEDICAL RECORD NO.:  [id number]          PATIENT TYPE:  INP   LOCATION:  2105                         FACILITY:  [facility]   PHYSICIAN:  Ollen Gross. Vernell Morgans, M.D. DATE OF BIRTH:  03-01-1956   DATE OF ADMISSION:  11/07/2007  DATE OF DISCHARGE:                              HISTORY & PHYSICAL   Ms. Ozanich is a 55 year old black female who is about 3 weeks out from a  resection of a dermoid cyst on her right ovary.  She initially did well,  but over the last week has had increase in abdominal pain.  She has been  running low-grade fevers at home.  She has had some nausea and vomiting  and has noticed decreased level of consciousness today.  She was brought  to the emergency department for further evaluation.  She complains of  severe abdominal pain.   Her past medical history is significant for dermoid cyst and  hypertension, and past surgical history is significant for 2 C-sections  and resection of a dermoid cyst.   MEDICATIONS:  Vicodin, lisinopril and hydrochlorothiazide.   ALLERGIES:  No known drug allergies.   SOCIAL HISTORY:  She denies history of tobacco or tobacco products.   FAMILY HISTORY:  Noncontributory.   PHYSICAL EXAMINATION:  VITAL SIGNS:  Her temp is 99.2, blood pressure  84/49, and pulse of 101.  GENERAL:  She is a well-developed, well-nourished black female in  significant distress.  SKIN:  Warm and dry.  No jaundice.  EYES:  Extraocular muscles intact.  Pupils equal, round, and reactive to  light.  Her sclerae is nonicteric.  LUNGS:  Clear bilaterally with no use of accessory inspiratory muscles.  HEART:  Regular rate and rhythm, tachycardic, impulse in the left chest.  ABDOMEN:  Diffusely tender with guarding and peritonitis.  She has a  well-healed lower midline incision.  EXTREMITIES:  No cyanosis, clubbing, or edema.  PSYCHOLOGIC:  She appears to be alert and oriented, although she is  little bit  lethargic.   LABORATORY DATA:  White count of 22.9 and creatinine of 5.2.   Her CT scan was reviewed with radiologist and did show significant free  anterior abdominal air and free anterior abdominal fluid consistent with  a bowel perforation.   ASSESSMENT AND PLAN:  This is a 55 year old black female 3 weeks out  from resection of a dermoid cyst from her right ovary who now has signs  of bowel perforation, sepsis, and peritonitis.  Because of this, I think  she needs to be explored tonight.  I have discussed with her and her  family the risks and benefits of the operation to do this as  well as some other technical aspects including the possibility of more  bowel injury given the fact she is only 3 weeks out from surgery and the  possible need for colostomy, and they understand and wish to proceed.  We will admit her to the ICU for resuscitation and then proceed to the  operating room as soon as possible.  Ollen Gross. Vernell Morgans, M.D.  Electronically Signed     PST/MEDQ  D:  11/07/2007  T:  11/08/2007  Job:  [id number]

## 2010-07-28 NOTE — Consult Note (Signed)
[name], RECHT                 ACCOUNT NO.:  [id number]   MEDICAL RECORD NO.:  [id number]          PATIENT TYPE:  INP   LOCATION:  2105                         FACILITY:  [facility]   PHYSICIAN:  Dyke Maes, M.D.DATE OF BIRTH:  06-11-1955   DATE OF CONSULTATION:  11/09/2007  DATE OF DISCHARGE:                                 CONSULTATION   REFERRING PHYSICIAN:  Critical Care Medicine.   REASON FOR CONSULT:  Renal failure.   HISTORY OF PRESENT ILLNESS:  This is a 55 year old African American  female admitted on August 25, , 2009, and found to have a perforated  appendix and in shock.  She was admitted and had an exploratory  laparotomy and in postop, has developed oliguric renal failure with  worsening acidosis.  We have been asked to consult to see the need for  CVVHD.  Reportedly, she may have had appendicitis for up to 1 week prior  to admission, but because she had had recent surgery for right dermoid  cyst removal from an ovary, she was taking Vicodin postop and thought  her pain likely was related to that previous surgery.   PAST MEDICAL HISTORY:  Per records reveals hypertension.   PAST SURGICAL HISTORY:  1. A right dominant cyst removal from the right ovary approximately 2      weeks ago.  2. C-section x2.  3. Left breast mass excision.  4. History of an ex lap for perforated appendectomy this      hospitalization.   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  She denies tobacco,  alcohol, or drug use per this previous note.   HOME MEDICATIONS:  Per previous note include Vicodin, lisinopril, and  HCTZ.   ALLERGIES:  No known drug allergies.   CURRENT MEDICATIONS:  1. Caspofungin.  2. Peridex.  3. Xigris.  4. Unfractionated heparin.  5. Protonix.  6. Zosyn.  7. Vancomycin.  8. P.r.n. Tylenol.  9. Fentanyl drip.  10.Labetalol p.r.n.  11.Versed drip.  12.Morphine p.r.n.  13.Levophed drip.  14.Vasopressin drip.   CURRENT IV FLUIDS:  D5 normal saline at 20 mL an hour.   FAMILY HISTORY:  Unable to obtain secondary to the patient being sedated  and intubated.   REVIEW OF SYSTEMS:  Also, unable to obtain secondary to the patient  being sedated and intubated.   PHYSICAL EXAMINATION:  VITAL SIGNS:  Temperature, current is 36.3,  temperature range 36.3 to 39.4; pulse current 68, range 58 to 109;  respiratory rate 26; and blood pressure noninvasive 152/73 with range 73  to 152 over 29 to 73, and invasive BP currently is 116/66 with range 75  to 137 over 47 to 85.  She is currently 99% on ventilator at 40% FiO2.  Her current vent settings include a PEEP of 5, rate of 26, FiO2 of 40%  and is on ARDS protocol.  Current CBG is 169.  Current CVP is 14.  I's  and O's for the last 24 hours  include 6174.7 mL in and 365 out of which  is 265 is urine.  GENERAL:  This is an obese [place] American female who is sedated and  currently intubated.  HEENT:  Normocephalic and atraumatic with sclerae clear.  Pupils are  pinpoint.  Nares are clear without discharge.  Moist mucous membranes.  She is intubated.  NECK:  Supple without JVD.  CARDIOVASCULAR:  Regular rate and rhythm.  Normal S1 and S2 without  murmurs, rubs, or gallops and normal PMI.  Pulses are 1+ in all  extremities.  LUNGS:  Coarse breath sounds bilaterally with decreased breath sounds at  the bases, right greater than left.  SKIN:  No rashes or lesions.  GI:  Soft, nontender, and nondistended abdomen with no  hepatosplenomegaly.  She is obese.  Her dressing from her surgery is  clean, dry, and intact and there is serosanguineous fluid in her JP  drain.  EXTREMITIES:  No cyanosis, clubbing, or edema.  SCDs are currently in  place.  ACCESS:  The patient currently has a left IJ temporary cath placed  today.  NEUROLOGIC:  The patient is obtunded and pupils are pinpoint  bilaterally.  She is currently on  fentanyl.   STUDIES:  Renal ultrasound revealed a right kidney of 11.4 cm in size  and left kidney of 13.6 cm in size.  Chest x-ray revealed  hypoventilation with bibasilar atelectasis and probable edema, and there  was note of the fact that she is intubated and has a central venous  line.   LABORATORY DATA:  White blood cell count 13.6, hemoglobin 7.5,  hematocrit 22.5, and platelet count 546.  Sodium 138, potassium 3.9,  chloride 107, bicarb 19, BUN 49, creatinine 6.34 trended up from 4.39  two days ago, and glucose 154.  Calcium 7.6, mag 1.9, phosphorus 6.3,  AST 15, ALT 31, total bilirubin 0.5, alkaline phosphatase 76, total  protein 5.4, and albumin 1.5.  Peritoneal abscess culture x2 were no  growth to date and blood culture x2 were also no growth to date.  AM  cortisol of 37.8.  Lactic acid is 1.7.  ABG reveals pH 7.314, CO2 of  36.4, oxygen 106, and bicarb 18.6.  Urinalysis reveals specific gravity  of 1.030, pH of 5, few epithelial cells, granular casts, 7-10 red blood  cells and few bacteria.  Of note, her labs on October 18, 2007, revealed  hemoglobin of 11.7, platelet count 345, and white blood cell count 8.0.  Creatinine 0.62.   ASSESSMENT:  This is a 55 year old African American female in septic  shock, status post a perforated appendix, now with oliguric acute kidney  injury.   PLAN:  1. Shock is certainly per the primary team of Critical Care Medicine.      She is currently on broad-spectrum antibiotic.  She is intubated in      the ARDS protocol.  A 2-D echo is pending to evaluate heart      function.  2. Acute kidney injury, status post hypoperfusion from shock.  Renal      ultrasound is reassuring as well as a recent history of a normal      creatinine.  Unlikely this is secondary to poor perfusion with      shock.  While her potassium is normal, the patient is slightly      acidotic despite changes in vent settings, so the patient likely      will need in the  next day or two, some  CVVHD.  Currently, a urine      sodium and urine creatinine are pending.  We will monitor urine      output closely.  The patient does not look hypervolemic at this      time and thus would probably do well with a little blood      particularly in light of her hemoglobin to make sure her      intravascular volume is full.  Certainly, we will need to monitor      CVP to make sure that it stays at appropriate level.  At this time,      it is felt that her acute kidney injury is now ischemic and acute      tubular necrosis.  3. Anemia postop.  The primary team plans on transfusing 2 units of      blood.  4. Prophylaxis, unfractionated heparin, PPI, Peridex, and SCDs.  5. Disposition is pending recovery, and is per primary team.      Ancil Boozer, MD  Electronically Signed     ______________________________  Dyke Maes, M.D.    SA/MEDQ  D:  11/09/2007  T:  11/10/2007  Job:  [id number]

## 2010-07-29 ENCOUNTER — Other Ambulatory Visit (HOSPITAL_COMMUNITY)
Admission: RE | Admit: 2010-07-29 | Discharge: 2010-07-29 | Disposition: A | Discharge status: Home / Self Care | Payer: Self-pay | Source: Ambulatory Visit | Attending: Internal Medicine | Admitting: Internal Medicine

## 2010-07-29 ENCOUNTER — Other Ambulatory Visit: Payer: Self-pay

## 2010-07-29 DIAGNOSIS — Z01419 Encounter for gynecological examination (general) (routine) without abnormal findings: Secondary | ICD-10-CM | POA: Insufficient documentation

## 2010-07-31 NOTE — Op Note (Signed)
[place]  Patient:    [name], [name] Visit Number: [id number] MRN: [id number]          Service Type: Attending:  Abigail Miyamoto, M.D. Dictated by:   Abigail Miyamoto, M.D. Proc. Date: 02/02/01                             Operative Report  PREOPERATIVE DIAGNOSIS:  Left breast mass.  POSTOPERATIVE DIAGNOSIS:  Left breast mass.  PROCEDURE:  Excision of left breast mass.  SURGEON:  Abigail Miyamoto, M.D.  ANESTHESIA:  1% lidocaine and monitored anesthesia care.  INDICATION:  [name] is a 55 year old female with a family history of breast cancer, who presented with mammographic findings consistent with a mass at the 3 oclock position of the left breast just under the areola.  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  After discussion she requested excision of this.  DESCRIPTION OF PROCEDURE:  The patient was brought to the operating room, identified as [name].  She was placed supine on the operating room table, and then anesthesia was induced.  Her breast was then prepped and draped in the usual sterile fashion.  The skin around the areola on the lateral aspect was then anesthetized with 1% lidocaine.  A circumareolar incision was then created on the lateral aspect of the areola.  A generous excision of a large amount of breast tissue underneath and just lateral to the areola was performed with the electrocautery, going down to near but not exactly to the chest wall.  A large breast specimen was completely removed in its entirety with the electrocautery.  The small mass was felt to be contained in the breast specimen.  The rest of the cavity and surrounding breast tissue felt normal in texture, and no other mass was identified.  The wound was then thoroughly irrigated with normal saline.  Hemostasis was then achieved with cautery.  The subcutaneous tissue was then closed with interrupted 3-0 Vicryl suture.  The skin was  closed with a running 4-0 Monocryl.  Steri-Strips, gauze, and tape were then applied.  The patient tolerated the procedure well. All sponge, needle, and instrument counts were correct at the end of the procedure.  The patient was then taken in a stable condition from the operating room to the recovery room. Dictated by:   Abigail Miyamoto, M.D. Attending:  Abigail Miyamoto, M.D. DD:  02/02/01 TD:  02/02/01 Job: [id number]

## 2010-12-11 LAB — CBC
HCT: 33 — ABNORMAL LOW
HCT: 35.5 — ABNORMAL LOW
Hemoglobin: 11.7 — ABNORMAL LOW
MCHC: 32.6
MCV: 83.6
MCV: 84.2
Platelets: 344
RBC: 4.24
WBC: 13.5 — ABNORMAL HIGH
WBC: 8

## 2010-12-11 LAB — BASIC METABOLIC PANEL
Chloride: 103
GFR calc Af Amer: >60
Potassium: 3.8
Sodium: 138

## 2010-12-14 LAB — GLUCOSE, CAPILLARY
Glucose-Capillary: 100 — ABNORMAL HIGH
Glucose-Capillary: 111 — ABNORMAL HIGH
Glucose-Capillary: 112 — ABNORMAL HIGH
Glucose-Capillary: 112 — ABNORMAL HIGH
Glucose-Capillary: 114 — ABNORMAL HIGH
Glucose-Capillary: 114 — ABNORMAL HIGH
Glucose-Capillary: 120 — ABNORMAL HIGH
Glucose-Capillary: 122 — ABNORMAL HIGH
Glucose-Capillary: 123 — ABNORMAL HIGH
Glucose-Capillary: 124 — ABNORMAL HIGH
Glucose-Capillary: 124 — ABNORMAL HIGH
Glucose-Capillary: 126 — ABNORMAL HIGH
Glucose-Capillary: 130 — ABNORMAL HIGH
Glucose-Capillary: 131 — ABNORMAL HIGH
Glucose-Capillary: 131 — ABNORMAL HIGH
Glucose-Capillary: 135 — ABNORMAL HIGH
Glucose-Capillary: 136 — ABNORMAL HIGH
Glucose-Capillary: 136 — ABNORMAL HIGH
Glucose-Capillary: 137 — ABNORMAL HIGH
Glucose-Capillary: 137 — ABNORMAL HIGH
Glucose-Capillary: 138 — ABNORMAL HIGH
Glucose-Capillary: 140 — ABNORMAL HIGH
Glucose-Capillary: 141 — ABNORMAL HIGH
Glucose-Capillary: 142 — ABNORMAL HIGH
Glucose-Capillary: 143 — ABNORMAL HIGH
Glucose-Capillary: 145 — ABNORMAL HIGH
Glucose-Capillary: 145 — ABNORMAL HIGH
Glucose-Capillary: 146 — ABNORMAL HIGH
Glucose-Capillary: 148 — ABNORMAL HIGH
Glucose-Capillary: 149 — ABNORMAL HIGH
Glucose-Capillary: 152 — ABNORMAL HIGH
Glucose-Capillary: 152 — ABNORMAL HIGH
Glucose-Capillary: 154 — ABNORMAL HIGH
Glucose-Capillary: 155 — ABNORMAL HIGH
Glucose-Capillary: 162 — ABNORMAL HIGH
Glucose-Capillary: 163 — ABNORMAL HIGH
Glucose-Capillary: 164 — ABNORMAL HIGH
Glucose-Capillary: 165 — ABNORMAL HIGH
Glucose-Capillary: 167 — ABNORMAL HIGH
Glucose-Capillary: 192 — ABNORMAL HIGH
Glucose-Capillary: 216 — ABNORMAL HIGH
Glucose-Capillary: 85
Glucose-Capillary: 105 — ABNORMAL HIGH
Glucose-Capillary: 107 — ABNORMAL HIGH
Glucose-Capillary: 108 — ABNORMAL HIGH
Glucose-Capillary: 110 — ABNORMAL HIGH
Glucose-Capillary: 110 — ABNORMAL HIGH
Glucose-Capillary: 111 — ABNORMAL HIGH
Glucose-Capillary: 111 — ABNORMAL HIGH
Glucose-Capillary: 111 — ABNORMAL HIGH
Glucose-Capillary: 113 — ABNORMAL HIGH
Glucose-Capillary: 115 — ABNORMAL HIGH
Glucose-Capillary: 118 — ABNORMAL HIGH
Glucose-Capillary: 119 — ABNORMAL HIGH
Glucose-Capillary: 122 — ABNORMAL HIGH
Glucose-Capillary: 122 — ABNORMAL HIGH
Glucose-Capillary: 122 — ABNORMAL HIGH
Glucose-Capillary: 123 — ABNORMAL HIGH
Glucose-Capillary: 123 — ABNORMAL HIGH
Glucose-Capillary: 124 — ABNORMAL HIGH
Glucose-Capillary: 125 — ABNORMAL HIGH
Glucose-Capillary: 127 — ABNORMAL HIGH
Glucose-Capillary: 127 — ABNORMAL HIGH
Glucose-Capillary: 129 — ABNORMAL HIGH
Glucose-Capillary: 129 — ABNORMAL HIGH
Glucose-Capillary: 130 — ABNORMAL HIGH
Glucose-Capillary: 131 — ABNORMAL HIGH
Glucose-Capillary: 131 — ABNORMAL HIGH
Glucose-Capillary: 132 — ABNORMAL HIGH
Glucose-Capillary: 133 — ABNORMAL HIGH
Glucose-Capillary: 134 — ABNORMAL HIGH
Glucose-Capillary: 135 — ABNORMAL HIGH
Glucose-Capillary: 136 — ABNORMAL HIGH
Glucose-Capillary: 138 — ABNORMAL HIGH
Glucose-Capillary: 140 — ABNORMAL HIGH
Glucose-Capillary: 141 — ABNORMAL HIGH
Glucose-Capillary: 148 — ABNORMAL HIGH
Glucose-Capillary: 149 — ABNORMAL HIGH
Glucose-Capillary: 150 — ABNORMAL HIGH
Glucose-Capillary: 88
Glucose-Capillary: 98

## 2010-12-14 LAB — CULTURE, BLOOD (ROUTINE X 2)
Culture: NO GROWTH
Culture: NO GROWTH

## 2010-12-14 LAB — BASIC METABOLIC PANEL
BUN: 27 — ABNORMAL HIGH
BUN: 37 — ABNORMAL HIGH
BUN: 64 — ABNORMAL HIGH
BUN: 72 — ABNORMAL HIGH
BUN: 1 — ABNORMAL LOW
BUN: 10
BUN: 12
BUN: 16
BUN: 17
BUN: 3 — ABNORMAL LOW
BUN: 3 — ABNORMAL LOW
BUN: 39 — ABNORMAL HIGH
BUN: 5 — ABNORMAL LOW
CO2: 23
CO2: 26
CO2: 28
CO2: 31
CO2: 22
CO2: 25
CO2: 26
CO2: 28
CO2: 29
CO2: 29
CO2: 30
CO2: 30
CO2: 30
Calcium: 8.2 — ABNORMAL LOW
Calcium: 8.4
Calcium: 8.4
Calcium: 8.5
Calcium: 8.6
Calcium: 8.1 — ABNORMAL LOW
Calcium: 8.4
Calcium: 8.7
Calcium: 8.7
Calcium: 8.8
Calcium: 8.8
Calcium: 8.9
Calcium: 8.9
Calcium: 8.9
Calcium: 9.1
Chloride: 104
Chloride: 108
Chloride: 112
Chloride: 102
Chloride: 103
Chloride: 103
Chloride: 104
Chloride: 104
Chloride: 104
Chloride: 106
Chloride: 109
Creatinine, Ser: 0.52
Creatinine, Ser: 0.67
Creatinine, Ser: 0.9
Creatinine, Ser: 1.29 — ABNORMAL HIGH
Creatinine, Ser: 1.69 — ABNORMAL HIGH
Creatinine, Ser: 0.44
Creatinine, Ser: 0.44
Creatinine, Ser: 0.45
Creatinine, Ser: 0.45
Creatinine, Ser: 0.46
Creatinine, Ser: 0.49
Creatinine, Ser: 0.5
Creatinine, Ser: 0.5
Creatinine, Ser: 0.51
Creatinine, Ser: 1.45 — ABNORMAL HIGH
GFR calc Af Amer: 39 — ABNORMAL LOW
GFR calc Af Amer: >60
GFR calc Af Amer: >60
GFR calc Af Amer: 46 — ABNORMAL LOW
GFR calc Af Amer: >60
GFR calc Af Amer: >60
GFR calc Af Amer: >60
GFR calc Af Amer: >60
GFR calc Af Amer: >60
GFR calc non Af Amer: 13 — ABNORMAL LOW
GFR calc non Af Amer: 32 — ABNORMAL LOW
GFR calc non Af Amer: >60
GFR calc non Af Amer: 38 — ABNORMAL LOW
GFR calc non Af Amer: >60
GFR calc non Af Amer: >60
GFR calc non Af Amer: >60
GFR calc non Af Amer: >60
GFR calc non Af Amer: >60
GFR calc non Af Amer: >60
GFR calc non Af Amer: >60
GFR calc non Af Amer: >60
GFR calc non Af Amer: >60
Glucose, Bld: 132 — ABNORMAL HIGH
Glucose, Bld: 139 — ABNORMAL HIGH
Glucose, Bld: 152 — ABNORMAL HIGH
Glucose, Bld: 162 — ABNORMAL HIGH
Glucose, Bld: 101 — ABNORMAL HIGH
Glucose, Bld: 101 — ABNORMAL HIGH
Glucose, Bld: 102 — ABNORMAL HIGH
Glucose, Bld: 109 — ABNORMAL HIGH
Glucose, Bld: 112 — ABNORMAL HIGH
Glucose, Bld: 122 — ABNORMAL HIGH
Glucose, Bld: 123 — ABNORMAL HIGH
Glucose, Bld: 129 — ABNORMAL HIGH
Glucose, Bld: 132 — ABNORMAL HIGH
Glucose, Bld: 144 — ABNORMAL HIGH
Glucose, Bld: 99
Potassium: 3.7
Potassium: 2.6 — CL
Potassium: 3.1 — ABNORMAL LOW
Potassium: 3.2 — ABNORMAL LOW
Potassium: 3.5
Potassium: 3.5
Potassium: 3.6
Potassium: 3.7
Potassium: 4.2
Potassium: 4.3
Potassium: 4.5
Sodium: 141
Sodium: 143
Sodium: 143
Sodium: 134 — ABNORMAL LOW
Sodium: 135
Sodium: 137
Sodium: 140
Sodium: 140
Sodium: 141
Sodium: 141
Sodium: 143

## 2010-12-14 LAB — URINE CULTURE
Colony Count: NO GROWTH
Colony Count: NO GROWTH
Culture: NO GROWTH
Culture: NO GROWTH
Special Requests: POSITIVE

## 2010-12-14 LAB — PREALBUMIN
Prealbumin: 10.8 — ABNORMAL LOW
Prealbumin: 17.3 — ABNORMAL LOW
Prealbumin: 11.5 — ABNORMAL LOW

## 2010-12-14 LAB — PHOSPHORUS
Phosphorus: 1.6 — ABNORMAL LOW
Phosphorus: 2.5
Phosphorus: 2.8
Phosphorus: 3.2
Phosphorus: 3.3
Phosphorus: 3.3
Phosphorus: 3.6
Phosphorus: 3.7
Phosphorus: 4.2
Phosphorus: 4.4
Phosphorus: 4.8 — ABNORMAL HIGH
Phosphorus: 4.9 — ABNORMAL HIGH
Phosphorus: 3.4
Phosphorus: 3.6
Phosphorus: 3.9

## 2010-12-14 LAB — DIFFERENTIAL
Basophils Absolute: 0
Basophils Absolute: 0
Basophils Absolute: 0.1
Basophils Absolute: 0
Basophils Absolute: 0
Basophils Absolute: 0.1
Basophils Relative: 0
Basophils Relative: 0
Basophils Relative: 0
Basophils Relative: 0
Basophils Relative: 0
Basophils Relative: 0
Basophils Relative: 0
Eosinophils Absolute: 0
Eosinophils Absolute: 0
Eosinophils Absolute: 0
Eosinophils Absolute: 0.3
Eosinophils Absolute: 0.4
Eosinophils Absolute: 0.1
Eosinophils Absolute: 0.2
Eosinophils Absolute: 0.3
Eosinophils Relative: 1
Eosinophils Relative: 1
Eosinophils Relative: 1
Eosinophils Relative: 2
Lymphocytes Relative: 10 — ABNORMAL LOW
Lymphocytes Relative: 4 — ABNORMAL LOW
Lymphocytes Relative: 5 — ABNORMAL LOW
Lymphocytes Relative: 6 — ABNORMAL LOW
Lymphocytes Relative: 6 — ABNORMAL LOW
Lymphocytes Relative: 6 — ABNORMAL LOW
Lymphocytes Relative: 11 — ABNORMAL LOW
Lymphocytes Relative: 11 — ABNORMAL LOW
Lymphocytes Relative: 12
Lymphocytes Relative: 9 — ABNORMAL LOW
Lymphs Abs: 1.4
Lymphs Abs: 1.5
Lymphs Abs: 1.8
Lymphs Abs: 1.6
Lymphs Abs: 1.7
Lymphs Abs: 1.8
Lymphs Abs: 1.9
Monocytes Absolute: 1.3 — ABNORMAL HIGH
Monocytes Absolute: 1.5 — ABNORMAL HIGH
Monocytes Absolute: 1.6 — ABNORMAL HIGH
Monocytes Absolute: 1.7 — ABNORMAL HIGH
Monocytes Absolute: 2.1 — ABNORMAL HIGH
Monocytes Absolute: 2.3 — ABNORMAL HIGH
Monocytes Absolute: 0.8
Monocytes Absolute: 0.8
Monocytes Absolute: 0.9
Monocytes Absolute: 1
Monocytes Absolute: 1.1 — ABNORMAL HIGH
Monocytes Relative: 7
Monocytes Relative: 5
Monocytes Relative: 6
Monocytes Relative: 6
Monocytes Relative: 7
Neutro Abs: 15.7 — ABNORMAL HIGH
Neutro Abs: 18.2 — ABNORMAL HIGH
Neutro Abs: 27 — ABNORMAL HIGH
Neutro Abs: 15.5 — ABNORMAL HIGH
Neutro Abs: 18.3 — ABNORMAL HIGH
Neutrophils Relative %: 85 — ABNORMAL HIGH
Neutrophils Relative %: 86 — ABNORMAL HIGH
Neutrophils Relative %: 87 — ABNORMAL HIGH
Neutrophils Relative %: 88 — ABNORMAL HIGH
Neutrophils Relative %: 88 — ABNORMAL HIGH
Neutrophils Relative %: 91 — ABNORMAL HIGH
Neutrophils Relative %: 83 — ABNORMAL HIGH
Neutrophils Relative %: 85 — ABNORMAL HIGH
WBC Morphology: INCREASED

## 2010-12-14 LAB — COMPREHENSIVE METABOLIC PANEL
ALT: 17
ALT: 21
ALT: 15
ALT: 17
ALT: 18
ALT: 21
ALT: 26
AST: 20
AST: 24
AST: 28
AST: 28
AST: 30
AST: 16
AST: 20
AST: 23
Albumin: 2 — ABNORMAL LOW
Albumin: 2.2 — ABNORMAL LOW
Albumin: 2.3 — ABNORMAL LOW
Albumin: 2.4 — ABNORMAL LOW
Albumin: 2.2 — ABNORMAL LOW
Albumin: 2.2 — ABNORMAL LOW
Albumin: 2.2 — ABNORMAL LOW
Albumin: 2.3 — ABNORMAL LOW
Alkaline Phosphatase: 111
Alkaline Phosphatase: 122 — ABNORMAL HIGH
Alkaline Phosphatase: 123 — ABNORMAL HIGH
Alkaline Phosphatase: 148 — ABNORMAL HIGH
Alkaline Phosphatase: 150 — ABNORMAL HIGH
Alkaline Phosphatase: 90
Alkaline Phosphatase: 103
Alkaline Phosphatase: 75
Alkaline Phosphatase: 77
Alkaline Phosphatase: 86
BUN: 13
BUN: 18
BUN: 36 — ABNORMAL HIGH
BUN: 48 — ABNORMAL HIGH
BUN: 57 — ABNORMAL HIGH
BUN: 57 — ABNORMAL HIGH
BUN: 58 — ABNORMAL HIGH
BUN: 64 — ABNORMAL HIGH
BUN: 76 — ABNORMAL HIGH
BUN: 18
BUN: 2 — ABNORMAL LOW
BUN: 3 — ABNORMAL LOW
BUN: 7
BUN: <1 — ABNORMAL LOW
CO2: 26
CO2: 27
CO2: 27
CO2: 28
CO2: 33 — ABNORMAL HIGH
CO2: 22
CO2: 28
CO2: 30
CO2: 32
Calcium: 8.6
Calcium: 8.6
Calcium: 8.7
Calcium: 8.7
Calcium: 8.9
Calcium: 9.1
Calcium: 8.6
Calcium: 8.7
Calcium: 9.2
Chloride: 101
Chloride: 103
Chloride: 105
Chloride: 108
Chloride: 102
Chloride: 103
Chloride: 103
Chloride: 104
Chloride: 105
Chloride: 107
Chloride: 99
Creatinine, Ser: 0.51
Creatinine, Ser: 2.05 — ABNORMAL HIGH
Creatinine, Ser: 3.4 — ABNORMAL HIGH
Creatinine, Ser: 4.98 — ABNORMAL HIGH
Creatinine, Ser: 0.41
Creatinine, Ser: 0.47
Creatinine, Ser: 0.52
Creatinine, Ser: 0.52
GFR calc Af Amer: 11 — ABNORMAL LOW
GFR calc Af Amer: 17 — ABNORMAL LOW
GFR calc Af Amer: 28 — ABNORMAL LOW
GFR calc Af Amer: 31 — ABNORMAL LOW
GFR calc Af Amer: 38 — ABNORMAL LOW
GFR calc Af Amer: >60
GFR calc Af Amer: >60
GFR calc Af Amer: >60
GFR calc non Af Amer: 14 — ABNORMAL LOW
GFR calc non Af Amer: 15 — ABNORMAL LOW
GFR calc non Af Amer: 26 — ABNORMAL LOW
GFR calc non Af Amer: 9 — ABNORMAL LOW
GFR calc non Af Amer: >60
GFR calc non Af Amer: >60
GFR calc non Af Amer: >60
GFR calc non Af Amer: >60
Glucose, Bld: 119 — ABNORMAL HIGH
Glucose, Bld: 130 — ABNORMAL HIGH
Glucose, Bld: 139 — ABNORMAL HIGH
Glucose, Bld: 145 — ABNORMAL HIGH
Glucose, Bld: 156 — ABNORMAL HIGH
Glucose, Bld: 88
Glucose, Bld: 111 — ABNORMAL HIGH
Glucose, Bld: 111 — ABNORMAL HIGH
Glucose, Bld: 112 — ABNORMAL HIGH
Glucose, Bld: 124 — ABNORMAL HIGH
Glucose, Bld: 129 — ABNORMAL HIGH
Potassium: 3.3 — ABNORMAL LOW
Potassium: 3.5
Potassium: 3.7
Potassium: 4
Potassium: 4.8
Potassium: 2.8 — ABNORMAL LOW
Potassium: 2.9 — ABNORMAL LOW
Potassium: 4.6
Potassium: 4.7
Sodium: 151 — ABNORMAL HIGH
Sodium: 152 — ABNORMAL HIGH
Sodium: 130 — ABNORMAL LOW
Sodium: 137
Sodium: 140
Sodium: 140
Sodium: 142
Total Bilirubin: 0.5
Total Bilirubin: 0.6
Total Bilirubin: 0.8
Total Bilirubin: 0.8
Total Bilirubin: 0.8
Total Bilirubin: 0.2 — ABNORMAL LOW
Total Bilirubin: 0.2 — ABNORMAL LOW
Total Bilirubin: 0.4
Total Bilirubin: 0.5
Total Bilirubin: 0.5
Total Bilirubin: 0.5
Total Bilirubin: 0.6
Total Protein: 6.7
Total Protein: 6.8
Total Protein: 6.9
Total Protein: 7.5
Total Protein: 5.8 — ABNORMAL LOW
Total Protein: 6.3
Total Protein: 6.7

## 2010-12-14 LAB — CBC
HCT: 21.4 — ABNORMAL LOW
HCT: 22.7 — ABNORMAL LOW
HCT: 24.7 — ABNORMAL LOW
HCT: 24.9 — ABNORMAL LOW
HCT: 25.3 — ABNORMAL LOW
HCT: 25.4 — ABNORMAL LOW
HCT: 25.8 — ABNORMAL LOW
HCT: 25.8 — ABNORMAL LOW
HCT: 23.1 — ABNORMAL LOW
HCT: 26.9 — ABNORMAL LOW
HCT: 26.9 — ABNORMAL LOW
HCT: 27.4 — ABNORMAL LOW
HCT: 27.5 — ABNORMAL LOW
HCT: 27.6 — ABNORMAL LOW
HCT: 28 — ABNORMAL LOW
HCT: 28.3 — ABNORMAL LOW
HCT: 28.9 — ABNORMAL LOW
HCT: 28.9 — ABNORMAL LOW
HCT: 29.1 — ABNORMAL LOW
HCT: 29.6 — ABNORMAL LOW
Hemoglobin: 7.2 — CL
Hemoglobin: 8 — ABNORMAL LOW
Hemoglobin: 8.1 — ABNORMAL LOW
Hemoglobin: 8.3 — ABNORMAL LOW
Hemoglobin: 8.3 — ABNORMAL LOW
Hemoglobin: 8.4 — ABNORMAL LOW
Hemoglobin: 8.4 — ABNORMAL LOW
Hemoglobin: 10 — ABNORMAL LOW
Hemoglobin: 10 — ABNORMAL LOW
Hemoglobin: 10.3 — ABNORMAL LOW
Hemoglobin: 7.6 — CL
Hemoglobin: 8.8 — ABNORMAL LOW
Hemoglobin: 8.8 — ABNORMAL LOW
Hemoglobin: 8.9 — ABNORMAL LOW
Hemoglobin: 9 — ABNORMAL LOW
Hemoglobin: 9 — ABNORMAL LOW
Hemoglobin: 9.2 — ABNORMAL LOW
Hemoglobin: 9.5 — ABNORMAL LOW
Hemoglobin: 9.5 — ABNORMAL LOW
Hemoglobin: 9.7 — ABNORMAL LOW
Hemoglobin: 9.8 — ABNORMAL LOW
MCHC: 32.3
MCHC: 32.5
MCHC: 32.5
MCHC: 32.6
MCHC: 32.6
MCHC: 32.8
MCHC: 32.8
MCHC: 33.2
MCHC: 33.3
MCHC: 33.7
MCHC: 32.6
MCHC: 32.6
MCHC: 32.7
MCHC: 32.8
MCHC: 32.8
MCHC: 33
MCHC: 33
MCHC: 33.1
MCHC: 33.4
MCHC: 33.5
MCV: 87.8
MCV: 88.9
MCV: 89.2
MCV: 89.7
MCV: 90.3
MCV: 90.4
MCV: 88.7
MCV: 89.1
MCV: 89.4
MCV: 89.7
MCV: 89.9
MCV: 89.9
MCV: 90.2
MCV: 90.4
MCV: 90.5
MCV: 90.9
Platelets: 610 — ABNORMAL HIGH
Platelets: 615 — ABNORMAL HIGH
Platelets: 640 — ABNORMAL HIGH
Platelets: 671 — ABNORMAL HIGH
Platelets: 682 — ABNORMAL HIGH
Platelets: 740 — ABNORMAL HIGH
Platelets: 782 — ABNORMAL HIGH
Platelets: 802 — ABNORMAL HIGH
Platelets: 347
Platelets: 356
Platelets: 387
Platelets: 403 — ABNORMAL HIGH
Platelets: 461 — ABNORMAL HIGH
Platelets: 510 — ABNORMAL HIGH
Platelets: 606 — ABNORMAL HIGH
Platelets: 720 — ABNORMAL HIGH
Platelets: 742 — ABNORMAL HIGH
RBC: 2.8 — ABNORMAL LOW
RBC: 2.8 — ABNORMAL LOW
RBC: 2.83 — ABNORMAL LOW
RBC: 2.84 — ABNORMAL LOW
RBC: 2.9 — ABNORMAL LOW
RBC: 2.6 — ABNORMAL LOW
RBC: 3.02 — ABNORMAL LOW
RBC: 3.05 — ABNORMAL LOW
RBC: 3.07 — ABNORMAL LOW
RBC: 3.22 — ABNORMAL LOW
RBC: 3.27 — ABNORMAL LOW
RBC: 3.31 — ABNORMAL LOW
RBC: 3.32 — ABNORMAL LOW
RBC: 3.33 — ABNORMAL LOW
RDW: 17.1 — ABNORMAL HIGH
RDW: 17.9 — ABNORMAL HIGH
RDW: 18.3 — ABNORMAL HIGH
RDW: 19.1 — ABNORMAL HIGH
RDW: 19.1 — ABNORMAL HIGH
RDW: 19.3 — ABNORMAL HIGH
RDW: 19.6 — ABNORMAL HIGH
RDW: 19.9 — ABNORMAL HIGH
RDW: 20.2 — ABNORMAL HIGH
RDW: 16.7 — ABNORMAL HIGH
RDW: 16.8 — ABNORMAL HIGH
RDW: 16.8 — ABNORMAL HIGH
RDW: 16.9 — ABNORMAL HIGH
RDW: 17.1 — ABNORMAL HIGH
RDW: 17.1 — ABNORMAL HIGH
RDW: 17.2 — ABNORMAL HIGH
RDW: 17.3 — ABNORMAL HIGH
RDW: 18.2 — ABNORMAL HIGH
RDW: 18.5 — ABNORMAL HIGH
WBC: 19 — ABNORMAL HIGH
WBC: 21.1 — ABNORMAL HIGH
WBC: 23.7 — ABNORMAL HIGH
WBC: 29.2 — ABNORMAL HIGH
WBC: 30.6 — ABNORMAL HIGH
WBC: 33.1 — ABNORMAL HIGH
WBC: 35.5 — ABNORMAL HIGH
WBC: 12.5 — ABNORMAL HIGH
WBC: 13.1 — ABNORMAL HIGH
WBC: 15.3 — ABNORMAL HIGH
WBC: 18.1 — ABNORMAL HIGH
WBC: 18.2 — ABNORMAL HIGH
WBC: 18.7 — ABNORMAL HIGH
WBC: 21.4 — ABNORMAL HIGH
WBC: 22 — ABNORMAL HIGH

## 2010-12-14 LAB — CROSSMATCH
ABO/RH(D): A POS
Antibody Screen: NEGATIVE
Antibody Screen: POSITIVE
DAT, IgG: POSITIVE

## 2010-12-14 LAB — MAGNESIUM
Magnesium: 1.2 — ABNORMAL LOW
Magnesium: 1.4 — ABNORMAL LOW
Magnesium: 1.5
Magnesium: 1.5
Magnesium: 1.5
Magnesium: 1.5
Magnesium: 1.5
Magnesium: 1.5
Magnesium: 1.7
Magnesium: 1.7
Magnesium: 1.8
Magnesium: 1.9
Magnesium: 2
Magnesium: 2.1
Magnesium: 1.4 — ABNORMAL LOW
Magnesium: 1.5
Magnesium: 1.5
Magnesium: 1.6
Magnesium: 1.6
Magnesium: 1.6
Magnesium: 1.6
Magnesium: 1.7
Magnesium: 1.7
Magnesium: 1.8
Magnesium: 1.9
Magnesium: 1.9

## 2010-12-14 LAB — PROTIME-INR
INR: 1.4
INR: 1.4
Prothrombin Time: 17.8 — ABNORMAL HIGH

## 2010-12-14 LAB — RENAL FUNCTION PANEL
Albumin: 2.1 — ABNORMAL LOW
BUN: 29 — ABNORMAL HIGH
Calcium: 8.3 — ABNORMAL LOW
Creatinine, Ser: 2.48 — ABNORMAL HIGH
Phosphorus: 1.6 — ABNORMAL LOW

## 2010-12-14 LAB — ANAEROBIC CULTURE

## 2010-12-14 LAB — URINALYSIS, MICROSCOPIC ONLY
Bilirubin Urine: NEGATIVE
Ketones, ur: NEGATIVE
Nitrite: NEGATIVE
Urobilinogen, UA: 0.2

## 2010-12-14 LAB — TRIGLYCERIDES
Triglycerides: 108
Triglycerides: 168 — ABNORMAL HIGH
Triglycerides: 152 — ABNORMAL HIGH

## 2010-12-14 LAB — CHOLESTEROL, TOTAL
Cholesterol: 110
Cholesterol: 131
Cholesterol: 135

## 2010-12-14 LAB — CULTURE, ROUTINE-ABSCESS

## 2010-12-14 LAB — CATH TIP CULTURE

## 2010-12-14 LAB — HEPATIC FUNCTION PANEL
Albumin: 2 — ABNORMAL LOW
Bilirubin, Direct: 0.2
Total Bilirubin: 0.8

## 2010-12-14 LAB — CLOSTRIDIUM DIFFICILE EIA: C difficile Toxins A+B, EIA: NEGATIVE

## 2010-12-14 LAB — APTT: aPTT: 30

## 2010-12-15 LAB — COMPREHENSIVE METABOLIC PANEL
BUN: 3 — ABNORMAL LOW
CO2: 24
Chloride: 105
Creatinine, Ser: 0.42
GFR calc non Af Amer: >60
Total Bilirubin: 0.5

## 2010-12-15 LAB — BASIC METABOLIC PANEL
BUN: 5 — ABNORMAL LOW
CO2: 27
CO2: 28
CO2: 30
CO2: 31
Calcium: 9.3
Chloride: 104
Chloride: 105
Creatinine, Ser: 0.43
GFR calc Af Amer: >60
GFR calc Af Amer: >60
GFR calc non Af Amer: >60
GFR calc non Af Amer: >60
Glucose, Bld: 93
Glucose, Bld: 93
Glucose, Bld: 94
Glucose, Bld: 96
Potassium: 3.4 — ABNORMAL LOW
Sodium: 138
Sodium: 138
Sodium: 140
Sodium: 141

## 2010-12-15 LAB — CBC
HCT: 30.3 — ABNORMAL LOW
HCT: 30.9 — ABNORMAL LOW
HCT: 30.9 — ABNORMAL LOW
HCT: 31.1 — ABNORMAL LOW
HCT: 31.4 — ABNORMAL LOW
Hemoglobin: 10 — ABNORMAL LOW
Hemoglobin: 10.1 — ABNORMAL LOW
Hemoglobin: 10.3 — ABNORMAL LOW
Hemoglobin: 10.5 — ABNORMAL LOW
Hemoglobin: 10.9 — ABNORMAL LOW
MCHC: 32.8
MCHC: 32.9
MCHC: 32.9
MCV: 88.1
MCV: 89.9
Platelets: 433 — ABNORMAL HIGH
RBC: 3.37 — ABNORMAL LOW
RBC: 3.45 — ABNORMAL LOW
RBC: 3.52 — ABNORMAL LOW
RBC: 3.53 — ABNORMAL LOW
RBC: 3.75 — ABNORMAL LOW
RDW: 15.8 — ABNORMAL HIGH
RDW: 16.1 — ABNORMAL HIGH
RDW: 17 — ABNORMAL HIGH
WBC: 15.5 — ABNORMAL HIGH
WBC: 9.6

## 2010-12-15 LAB — MAGNESIUM
Magnesium: 1.5
Magnesium: 1.6

## 2010-12-15 LAB — DIFFERENTIAL
Basophils Absolute: 0.2 — ABNORMAL HIGH
Lymphocytes Relative: 11 — ABNORMAL LOW
Lymphs Abs: 1.7
Monocytes Absolute: 0.9
Neutro Abs: 12.6 — ABNORMAL HIGH

## 2010-12-15 LAB — URINALYSIS, ROUTINE W REFLEX MICROSCOPIC
Ketones, ur: NEGATIVE
Nitrite: NEGATIVE
Protein, ur: NEGATIVE

## 2010-12-15 LAB — CATH TIP CULTURE

## 2010-12-15 LAB — PREALBUMIN: Prealbumin: 11.2 — ABNORMAL LOW

## 2010-12-16 LAB — BASIC METABOLIC PANEL
BUN: 42 — ABNORMAL HIGH
BUN: 51 — ABNORMAL HIGH
BUN: 56 — ABNORMAL HIGH
BUN: 72 — ABNORMAL HIGH
CO2: 23
CO2: 25
CO2: 26
CO2: 26
CO2: 26
Calcium: 7.9 — ABNORMAL LOW
Calcium: 8 — ABNORMAL LOW
Calcium: 8.5
Calcium: 8.9
Chloride: 100
Chloride: 103
Chloride: 103
Chloride: 105
Chloride: 97
Creatinine, Ser: 4.48 — ABNORMAL HIGH
Creatinine, Ser: 4.99 — ABNORMAL HIGH
Creatinine, Ser: 5.33 — ABNORMAL HIGH
GFR calc Af Amer: 10 — ABNORMAL LOW
GFR calc Af Amer: 11 — ABNORMAL LOW
GFR calc Af Amer: 13 — ABNORMAL LOW
GFR calc Af Amer: 13 — ABNORMAL LOW
GFR calc Af Amer: 13 — ABNORMAL LOW
GFR calc Af Amer: 9 — ABNORMAL LOW
GFR calc non Af Amer: 11 — ABNORMAL LOW
GFR calc non Af Amer: 17 — ABNORMAL LOW
GFR calc non Af Amer: 8 — ABNORMAL LOW
GFR calc non Af Amer: 8 — ABNORMAL LOW
GFR calc non Af Amer: 9 — ABNORMAL LOW
GFR calc non Af Amer: 9 — ABNORMAL LOW
Glucose, Bld: 115 — ABNORMAL HIGH
Glucose, Bld: 117 — ABNORMAL HIGH
Potassium: 3.1 — ABNORMAL LOW
Potassium: 3.3 — ABNORMAL LOW
Potassium: 4
Potassium: 4
Potassium: 4.6
Sodium: 135
Sodium: 136
Sodium: 137
Sodium: 137
Sodium: 137
Sodium: 139

## 2010-12-16 LAB — GLUCOSE, CAPILLARY
Glucose-Capillary: 100 — ABNORMAL HIGH
Glucose-Capillary: 106 — ABNORMAL HIGH
Glucose-Capillary: 106 — ABNORMAL HIGH
Glucose-Capillary: 107 — ABNORMAL HIGH
Glucose-Capillary: 107 — ABNORMAL HIGH
Glucose-Capillary: 108 — ABNORMAL HIGH
Glucose-Capillary: 108 — ABNORMAL HIGH
Glucose-Capillary: 112 — ABNORMAL HIGH
Glucose-Capillary: 116 — ABNORMAL HIGH
Glucose-Capillary: 116 — ABNORMAL HIGH
Glucose-Capillary: 117 — ABNORMAL HIGH
Glucose-Capillary: 118 — ABNORMAL HIGH
Glucose-Capillary: 119 — ABNORMAL HIGH
Glucose-Capillary: 119 — ABNORMAL HIGH
Glucose-Capillary: 122 — ABNORMAL HIGH
Glucose-Capillary: 123 — ABNORMAL HIGH
Glucose-Capillary: 123 — ABNORMAL HIGH
Glucose-Capillary: 125 — ABNORMAL HIGH
Glucose-Capillary: 126 — ABNORMAL HIGH
Glucose-Capillary: 126 — ABNORMAL HIGH
Glucose-Capillary: 126 — ABNORMAL HIGH
Glucose-Capillary: 127 — ABNORMAL HIGH
Glucose-Capillary: 127 — ABNORMAL HIGH
Glucose-Capillary: 129 — ABNORMAL HIGH
Glucose-Capillary: 129 — ABNORMAL HIGH
Glucose-Capillary: 130 — ABNORMAL HIGH
Glucose-Capillary: 131 — ABNORMAL HIGH
Glucose-Capillary: 135 — ABNORMAL HIGH
Glucose-Capillary: 136 — ABNORMAL HIGH
Glucose-Capillary: 138 — ABNORMAL HIGH
Glucose-Capillary: 141 — ABNORMAL HIGH
Glucose-Capillary: 142 — ABNORMAL HIGH
Glucose-Capillary: 150 — ABNORMAL HIGH
Glucose-Capillary: 150 — ABNORMAL HIGH
Glucose-Capillary: 152 — ABNORMAL HIGH
Glucose-Capillary: 155 — ABNORMAL HIGH
Glucose-Capillary: 158 — ABNORMAL HIGH
Glucose-Capillary: 200 — ABNORMAL HIGH
Glucose-Capillary: 35 — CL
Glucose-Capillary: 61 — ABNORMAL LOW
Glucose-Capillary: 78
Glucose-Capillary: 86
Glucose-Capillary: 92
Glucose-Capillary: 93
Glucose-Capillary: 95

## 2010-12-16 LAB — URINALYSIS, ROUTINE W REFLEX MICROSCOPIC
Glucose, UA: NEGATIVE
Hgb urine dipstick: NEGATIVE
Ketones, ur: 15 — AB
pH: 5

## 2010-12-16 LAB — POCT I-STAT 7, (LYTES, BLD GAS, ICA,H+H)
Acid-base deficit: 1
Bicarbonate: 25.3 — ABNORMAL HIGH
Bicarbonate: 25.5 — ABNORMAL HIGH
Calcium, Ion: 1.12
Calcium, Ion: 1.22
Calcium, Ion: 1.26
Hemoglobin: 6.8 — CL
Hemoglobin: 8.2 — ABNORMAL LOW
O2 Saturation: 97
O2 Saturation: 98
Patient temperature: 97.9
Patient temperature: 98.3
Patient temperature: 98.6
Patient temperature: 98.6
Potassium: 3.4 — ABNORMAL LOW
Potassium: 4.3
TCO2: 25
TCO2: 27
pCO2 arterial: 38.3
pCO2 arterial: 42.6
pCO2 arterial: 46.9 — ABNORMAL HIGH
pH, Arterial: 7.34 — ABNORMAL LOW
pH, Arterial: 7.342 — ABNORMAL LOW
pO2, Arterial: 101 — ABNORMAL HIGH
pO2, Arterial: 111 — ABNORMAL HIGH

## 2010-12-16 LAB — COMPREHENSIVE METABOLIC PANEL
ALT: 11
ALT: 15
ALT: 1677 — ABNORMAL HIGH
ALT: 18
ALT: 25
AST: 30
AST: 36
AST: 58 — ABNORMAL HIGH
AST: 64 — ABNORMAL HIGH
AST: 97 — ABNORMAL HIGH
Albumin: 1.7 — ABNORMAL LOW
Albumin: 2.3 — ABNORMAL LOW
Alkaline Phosphatase: 101
Alkaline Phosphatase: 105
Alkaline Phosphatase: 119 — ABNORMAL HIGH
Alkaline Phosphatase: 121 — ABNORMAL HIGH
Alkaline Phosphatase: 190 — ABNORMAL HIGH
BUN: 38 — ABNORMAL HIGH
BUN: 43 — ABNORMAL HIGH
BUN: 56 — ABNORMAL HIGH
CO2: 25
CO2: 25
CO2: 26
CO2: 26
CO2: 28
Calcium: 7 — ABNORMAL LOW
Calcium: 8.4
Calcium: 8.5
Calcium: 8.9
Chloride: 105
Chloride: 105
Chloride: 95 — ABNORMAL LOW
Chloride: 99
Creatinine, Ser: 5.44 — ABNORMAL HIGH
Creatinine, Ser: 5.72 — ABNORMAL HIGH
Creatinine, Ser: 5.76 — ABNORMAL HIGH
GFR calc Af Amer: 10 — ABNORMAL LOW
GFR calc Af Amer: 10 — ABNORMAL LOW
GFR calc Af Amer: 8 — ABNORMAL LOW
GFR calc Af Amer: 9 — ABNORMAL LOW
GFR calc non Af Amer: 6 — ABNORMAL LOW
GFR calc non Af Amer: 8 — ABNORMAL LOW
GFR calc non Af Amer: 8 — ABNORMAL LOW
GFR calc non Af Amer: 8 — ABNORMAL LOW
Glucose, Bld: 113 — ABNORMAL HIGH
Glucose, Bld: 127 — ABNORMAL HIGH
Glucose, Bld: 145 — ABNORMAL HIGH
Glucose, Bld: 153 — ABNORMAL HIGH
Glucose, Bld: 156 — ABNORMAL HIGH
Glucose, Bld: 99
Potassium: 3.8
Potassium: 3.9
Potassium: 4.1
Potassium: 4.6
Sodium: 137
Sodium: 137
Sodium: 137
Sodium: 139
Sodium: 140
Total Bilirubin: 1.2
Total Bilirubin: 1.3 — ABNORMAL HIGH
Total Bilirubin: 1.6 — ABNORMAL HIGH
Total Bilirubin: 1.7 — ABNORMAL HIGH
Total Protein: 5.5 — ABNORMAL LOW
Total Protein: 5.6 — ABNORMAL LOW
Total Protein: 6.3
Total Protein: 7

## 2010-12-16 LAB — POCT I-STAT EG7
Acid-base deficit: 2
Acid-base deficit: 3 — ABNORMAL HIGH
Acid-base deficit: 4 — ABNORMAL HIGH
Acid-base deficit: 5 — ABNORMAL HIGH
Bicarbonate: 24.1 — ABNORMAL HIGH
Bicarbonate: 24.7 — ABNORMAL HIGH
Calcium, Ion: 0.41 — ABNORMAL LOW
Calcium, Ion: 0.59 — ABNORMAL LOW
Calcium, Ion: 0.67 — ABNORMAL LOW
HCT: 29 — ABNORMAL LOW
Hemoglobin: 8.8 — ABNORMAL LOW
O2 Saturation: 65
O2 Saturation: 71
Potassium: 3.8
Sodium: 141
Sodium: 143
TCO2: 26
pH, Ven: 7.27
pO2, Ven: 37
pO2, Ven: 42

## 2010-12-16 LAB — CBC
HCT: 20.8 — ABNORMAL LOW
HCT: 21.2 — ABNORMAL LOW
HCT: 22.2 — ABNORMAL LOW
HCT: 23 — ABNORMAL LOW
HCT: 23 — ABNORMAL LOW
HCT: 23.4 — ABNORMAL LOW
HCT: 23.5 — ABNORMAL LOW
HCT: 24.6 — ABNORMAL LOW
HCT: 25.6 — ABNORMAL LOW
Hemoglobin: 6 — CL
Hemoglobin: 6.9 — CL
Hemoglobin: 7.1 — CL
Hemoglobin: 7.3 — CL
Hemoglobin: 7.6 — CL
Hemoglobin: 7.9 — CL
Hemoglobin: 7.9 — CL
Hemoglobin: 8 — ABNORMAL LOW
Hemoglobin: 8.1 — ABNORMAL LOW
Hemoglobin: 8.1 — ABNORMAL LOW
Hemoglobin: 8.1 — ABNORMAL LOW
Hemoglobin: 8.3 — ABNORMAL LOW
Hemoglobin: 8.4 — ABNORMAL LOW
Hemoglobin: 8.9 — ABNORMAL LOW
Hemoglobin: 9.2 — ABNORMAL LOW
MCHC: 33.6
MCHC: 33.7
MCHC: 33.7
MCHC: 33.8
MCHC: 33.9
MCHC: 34.2
MCHC: 34.2
MCHC: 34.3
MCHC: 34.3
MCHC: 34.4
MCHC: 34.8
MCV: 88.5
MCV: 88.7
MCV: 89
MCV: 89
MCV: 89.8
MCV: 89.9
MCV: 90.5
MCV: 90.7
MCV: 90.9
MCV: 92.1
MCV: 92.3
MCV: 92.5
Platelets: 433 — ABNORMAL HIGH
Platelets: 687 — ABNORMAL HIGH
Platelets: 700 — ABNORMAL HIGH
Platelets: 718 — ABNORMAL HIGH
Platelets: 734 — ABNORMAL HIGH
RBC: 1.85 — ABNORMAL LOW
RBC: 2.21 — ABNORMAL LOW
RBC: 2.29 — ABNORMAL LOW
RBC: 2.45 — ABNORMAL LOW
RBC: 2.5 — ABNORMAL LOW
RBC: 2.54 — ABNORMAL LOW
RBC: 2.54 — ABNORMAL LOW
RBC: 2.59 — ABNORMAL LOW
RBC: 2.6 — ABNORMAL LOW
RBC: 2.62 — ABNORMAL LOW
RBC: 2.74 — ABNORMAL LOW
RBC: 2.74 — ABNORMAL LOW
RBC: 2.78 — ABNORMAL LOW
RBC: 2.89 — ABNORMAL LOW
RBC: 2.95 — ABNORMAL LOW
RDW: 15.3
RDW: 15.4
RDW: 15.4
RDW: 15.5
RDW: 15.7 — ABNORMAL HIGH
RDW: 15.9 — ABNORMAL HIGH
RDW: 16 — ABNORMAL HIGH
RDW: 16.3 — ABNORMAL HIGH
RDW: 16.4 — ABNORMAL HIGH
RDW: 16.7 — ABNORMAL HIGH
RDW: 17 — ABNORMAL HIGH
WBC: 20.5 — ABNORMAL HIGH
WBC: 24.5 — ABNORMAL HIGH
WBC: 25.9 — ABNORMAL HIGH
WBC: 26.1 — ABNORMAL HIGH
WBC: 27.8 — ABNORMAL HIGH
WBC: 28.1 — ABNORMAL HIGH
WBC: 28.8 — ABNORMAL HIGH
WBC: 39.3 — ABNORMAL HIGH
WBC: 43.2 — ABNORMAL HIGH
WBC: 44.4 — ABNORMAL HIGH
WBC: 44.9 — ABNORMAL HIGH

## 2010-12-16 LAB — RENAL FUNCTION PANEL
Albumin: 1.9 — ABNORMAL LOW
Albumin: 1.9 — ABNORMAL LOW
Albumin: 2 — ABNORMAL LOW
BUN: 37 — ABNORMAL HIGH
BUN: 37 — ABNORMAL HIGH
BUN: 43 — ABNORMAL HIGH
CO2: 22
CO2: 25
Calcium: 7.4 — ABNORMAL LOW
Calcium: 8 — ABNORMAL LOW
Calcium: 8.1 — ABNORMAL LOW
Calcium: 8.5
Chloride: 100
Chloride: 99
Creatinine, Ser: 4.77 — ABNORMAL HIGH
GFR calc Af Amer: 11 — ABNORMAL LOW
GFR calc Af Amer: 11 — ABNORMAL LOW
GFR calc Af Amer: 11 — ABNORMAL LOW
GFR calc Af Amer: 12 — ABNORMAL LOW
GFR calc non Af Amer: 10 — ABNORMAL LOW
GFR calc non Af Amer: 9 — ABNORMAL LOW
GFR calc non Af Amer: 9 — ABNORMAL LOW
GFR calc non Af Amer: 9 — ABNORMAL LOW
Glucose, Bld: 129 — ABNORMAL HIGH
Glucose, Bld: 82
Glucose, Bld: 87
Phosphorus: 3.2
Phosphorus: 5.9 — ABNORMAL HIGH
Phosphorus: 6 — ABNORMAL HIGH
Phosphorus: 6.3 — ABNORMAL HIGH
Phosphorus: 6.3 — ABNORMAL HIGH
Potassium: 4.2
Potassium: 4.5
Sodium: 135
Sodium: 137
Sodium: 140

## 2010-12-16 LAB — CULTURE, BLOOD (ROUTINE X 2)
Culture: NO GROWTH
Culture: NO GROWTH

## 2010-12-16 LAB — DIFFERENTIAL
Basophils Absolute: 0
Basophils Relative: 0
Eosinophils Absolute: 0.4
Eosinophils Relative: 2
Lymphocytes Relative: 5 — ABNORMAL LOW
Lymphs Abs: 1.7
Lymphs Abs: 2
Monocytes Absolute: 2.1 — ABNORMAL HIGH
Monocytes Relative: 3
Monocytes Relative: 9
Neutrophils Relative %: 91 — ABNORMAL HIGH

## 2010-12-16 LAB — URINE MICROSCOPIC-ADD ON

## 2010-12-16 LAB — POCT I-STAT 3, ART BLOOD GAS (G3+)
Bicarbonate: 29.9 — ABNORMAL HIGH
O2 Saturation: 97
TCO2: 27
TCO2: 31
pCO2 arterial: 41
pCO2 arterial: 45.4 — ABNORMAL HIGH
pH, Arterial: 7.363
pH, Arterial: 7.471 — ABNORMAL HIGH
pO2, Arterial: 128 — ABNORMAL HIGH
pO2, Arterial: 95

## 2010-12-16 LAB — APTT
aPTT: 29
aPTT: 30
aPTT: 30
aPTT: 34
aPTT: 38 — ABNORMAL HIGH
aPTT: 40 — ABNORMAL HIGH
aPTT: 42 — ABNORMAL HIGH
aPTT: 45 — ABNORMAL HIGH
aPTT: 79 — ABNORMAL HIGH

## 2010-12-16 LAB — CULTURE, BAL-QUANTITATIVE W GRAM STAIN

## 2010-12-16 LAB — PROTIME-INR
INR: 1.2
INR: 1.4
Prothrombin Time: 15.7 — ABNORMAL HIGH
Prothrombin Time: 17.1 — ABNORMAL HIGH

## 2010-12-16 LAB — CROSSMATCH: ABO/RH(D): A POS

## 2010-12-16 LAB — CULTURE, BAL-QUANTITATIVE: Colony Count: 25000

## 2010-12-16 LAB — PREPARE FRESH FROZEN PLASMA

## 2010-12-16 LAB — PHOSPHORUS
Phosphorus: 4.3
Phosphorus: 4.5
Phosphorus: 4.6
Phosphorus: 7.2 — ABNORMAL HIGH

## 2010-12-16 LAB — MAGNESIUM
Magnesium: 1.9
Magnesium: 2
Magnesium: 2
Magnesium: 2.1
Magnesium: 2.2
Magnesium: 2.2
Magnesium: 2.3
Magnesium: 2.3
Magnesium: 2.3

## 2010-12-16 LAB — PREALBUMIN
Prealbumin: 14.2 — ABNORMAL LOW
Prealbumin: 14.4 — ABNORMAL LOW

## 2010-12-16 LAB — CATH TIP CULTURE
Culture: NO GROWTH
Culture: NO GROWTH

## 2010-12-16 LAB — URINE CULTURE
Colony Count: NO GROWTH
Culture: NO GROWTH

## 2010-12-16 LAB — VANCOMYCIN, RANDOM: Vancomycin Rm: 46.2

## 2010-12-16 LAB — DIC (DISSEMINATED INTRAVASCULAR COAGULATION)PANEL
D-Dimer, Quant: 16.51 — ABNORMAL HIGH
aPTT: 29

## 2010-12-16 LAB — TRIGLYCERIDES: Triglycerides: 161 — ABNORMAL HIGH

## 2010-12-16 LAB — CHOLESTEROL, TOTAL: Cholesterol: 152

## 2010-12-16 LAB — IRON AND TIBC: UIBC: 193

## 2011-03-31 ENCOUNTER — Telehealth (INDEPENDENT_AMBULATORY_CARE_PROVIDER_SITE_OTHER): Payer: Self-pay | Admitting: General Surgery

## 2011-03-31 NOTE — Telephone Encounter (Signed)
I RECEIVED A VOICE MAIL MESSAGE YESTERDAY 03-30-11 FROM [facility] RE QUESTION OF VOUCHING FOR HOSPITALIZATION TIME SEVERAL YEARS AGO/ I LEFT MESSAGE ON RETURN #- [phone] FOR PT TO CALL BACK/GY

## 2011-08-16 ENCOUNTER — Other Ambulatory Visit (HOSPITAL_COMMUNITY)
Admission: RE | Admit: 2011-08-16 | Discharge: 2011-08-16 | Disposition: A | Discharge status: Home / Self Care | Payer: BC Managed Care – PPO | Source: Ambulatory Visit | Attending: Family Medicine | Admitting: Family Medicine

## 2011-08-16 ENCOUNTER — Other Ambulatory Visit: Payer: Self-pay | Admitting: Family Medicine

## 2011-08-16 DIAGNOSIS — Z01419 Encounter for gynecological examination (general) (routine) without abnormal findings: Secondary | ICD-10-CM | POA: Insufficient documentation

## 2012-08-15 ENCOUNTER — Other Ambulatory Visit (HOSPITAL_COMMUNITY)
Admission: RE | Admit: 2012-08-15 | Discharge: 2012-08-15 | Disposition: A | Discharge status: Home / Self Care | Payer: 59 | Source: Ambulatory Visit | Attending: Family Medicine | Admitting: Family Medicine

## 2012-08-15 ENCOUNTER — Other Ambulatory Visit: Payer: Self-pay | Admitting: Family Medicine

## 2012-08-15 DIAGNOSIS — Z0000 Encounter for general adult medical examination without abnormal findings: Secondary | ICD-10-CM | POA: Insufficient documentation

## 2012-10-16 ENCOUNTER — Other Ambulatory Visit: Payer: Self-pay | Admitting: Radiology

## 2012-10-16 DIAGNOSIS — N6452 Nipple discharge: Secondary | ICD-10-CM

## 2012-10-23 ENCOUNTER — Ambulatory Visit
Admission: RE | Admit: 2012-10-23 | Discharge: 2012-10-23 | Disposition: A | Discharge status: Home / Self Care | Payer: 59 | Source: Ambulatory Visit | Attending: Radiology | Admitting: Radiology

## 2012-10-23 DIAGNOSIS — N6452 Nipple discharge: Secondary | ICD-10-CM

## 2012-11-07 ENCOUNTER — Encounter (INDEPENDENT_AMBULATORY_CARE_PROVIDER_SITE_OTHER): Payer: Self-pay | Admitting: General Surgery

## 2012-11-07 ENCOUNTER — Ambulatory Visit (INDEPENDENT_AMBULATORY_CARE_PROVIDER_SITE_OTHER): Payer: 59 | Admitting: General Surgery

## 2012-11-07 VITALS — BP 132/84 | HR 76 | Resp 20 | Ht 65.5 in | Wt 240.4 lb

## 2012-11-07 DIAGNOSIS — N6452 Nipple discharge: Secondary | ICD-10-CM | POA: Insufficient documentation

## 2012-11-07 DIAGNOSIS — N6459 Other signs and symptoms in breast: Secondary | ICD-10-CM

## 2012-11-12 NOTE — Progress Notes (Signed)
Patient ID: [name], female   DOB: Apr 13, 1955, 57 y.o.   MRN: [id number]  Chief Complaint  Patient presents with  . New Evaluation    eval bloody nipple disharge    [name] is a 57 y.o. female.  Referred by Dr Rogelia Mire HPI 39 yof who has family history of breast cancer who initially had abnormal screening mm on left in May 2012.  This was followed with area being stable.  She has prior benign biopsy by history on the left side.  She had some bloody nipple discharge on the right in 2013 that was evaluated with mm and [place] that showed only ductal ectasia.  A ductogram was performed that showed "no intraductal mass seen in dilated ductal segment centered at about 3 oclock in the right breast."  In august of 2014 she still is having some very intermittent spontaneous right sided (no left sided) bloody discharge (described more as pink or brown today than frank blood).  She underwent evaluation with mm that shows no evidence of malignancy and ultrasound that shows mild duct ectasia in the 2 o'clock position 1 cm from nipple but no mass.  She was then referred for mr due to persistence but she was unable to tolerate contrast and positioning so this was aborted.  She was then referred to see me for evaluation.  At this time she has no complaints and has had no discharge in over 2 months.  Past Medical History  Diagnosis Date  . Hypertension     Past Surgical History  Procedure Laterality Date  . Appendectomy    . Cesarean section     appy was perforated requiring laparotomy, prolonged hospitalization, required tracheostomy  Family History  Problem Relation Age of Onset  . Cancer Mother     breast  . Heart attack Mother     Social History History  Substance Use Topics  . Smoking status: Former Games developer  . Smokeless tobacco: Never Used     Comment: "only smoked for 2 years in high school"  . Alcohol Use: No    No Known Allergies  Current Outpatient Prescriptions    Medication Sig Dispense Refill  . cloNIDine (CATAPRES) 0.2 MG tablet       . lisinopril (PRINIVIL,ZESTRIL) 5 MG tablet Take 5 mg by mouth daily.       No current facility-administered medications for this visit.    Review of Systems Review of Systems  Constitutional: Negative for fever, chills and unexpected weight change.  HENT: Negative for hearing loss, congestion, sore throat, trouble swallowing and voice change.   Eyes: Negative for visual disturbance.  Respiratory: Negative for cough and wheezing.   Cardiovascular: Negative for chest pain, palpitations and leg swelling.  Gastrointestinal: Negative for nausea, vomiting, abdominal pain, diarrhea, constipation, blood in stool, abdominal distention and anal bleeding.  Genitourinary: Negative for hematuria, vaginal bleeding and difficulty urinating.  Musculoskeletal: Negative for arthralgias.  Skin: Negative for rash and wound.  Neurological: Negative for seizures, syncope and headaches.  Hematological: Negative for adenopathy. Does not bruise/bleed easily.  Psychiatric/Behavioral: Negative for confusion.    Blood pressure 132/84, pulse 76, resp. rate 20, height 5' 5.5" (1.664 m), weight 240 lb 6.4 oz (109.045 kg).  Physical Exam Physical Exam  Vitals reviewed. Constitutional: She appears well-developed and well-nourished.  Pulmonary/Chest: Right breast exhibits no inverted nipple, no mass, no nipple discharge, no skin change and no tenderness. Left breast exhibits no inverted nipple, no mass, no nipple discharge,  no skin change and no tenderness.    Lymphadenopathy:    She has no cervical adenopathy.    She has no axillary adenopathy.       Right: No supraclavicular adenopathy present.       Left: No supraclavicular adenopathy present.    Data Reviewed All mm/us reports from [facility]  Assessment    Right nipple discharge    Plan    I cannot identify any discharge or abnormality on her exam today.  She has no real  imaging abnormality either but was unable to tolerate MR.  This has been going on for a while at this point also.  We discussed options which are close interval follow up or central duct excision.  I told her if she has more discharge to please call me earlier.  She would very much like to undergo close follow up and understands there could be a cancer underlying this although I think this is unlikely at this point. I would also feel comfortable following her and will see her back after next mm/us.  If this is stable we could just follow her clinically at that point.        Lecil Tapp 11/12/2012, 4:30 PM

## 2012-11-20 ENCOUNTER — Encounter (INDEPENDENT_AMBULATORY_CARE_PROVIDER_SITE_OTHER): Payer: Self-pay

## 2012-12-12 ENCOUNTER — Encounter (INDEPENDENT_AMBULATORY_CARE_PROVIDER_SITE_OTHER): Payer: Self-pay

## 2013-01-17 ENCOUNTER — Encounter (INDEPENDENT_AMBULATORY_CARE_PROVIDER_SITE_OTHER): Payer: Self-pay

## 2013-04-12 ENCOUNTER — Telehealth (INDEPENDENT_AMBULATORY_CARE_PROVIDER_SITE_OTHER): Payer: Self-pay | Admitting: *Deleted

## 2013-04-12 NOTE — Telephone Encounter (Signed)
LMOM for pt to return my call.  I was calling to make sure pt is aware of [facility] and US on 04/16/13 @ 9:45am.  If she needs to reschedule she may call [phone].

## 2013-05-16 ENCOUNTER — Encounter (INDEPENDENT_AMBULATORY_CARE_PROVIDER_SITE_OTHER): Payer: Self-pay

## 2013-05-29 ENCOUNTER — Other Ambulatory Visit: Payer: Self-pay | Admitting: Family Medicine

## 2013-05-29 ENCOUNTER — Ambulatory Visit
Admission: RE | Admit: 2013-05-29 | Discharge: 2013-05-29 | Disposition: A | Discharge status: Home / Self Care | Payer: 59 | Source: Ambulatory Visit | Attending: Family Medicine | Admitting: Family Medicine

## 2013-05-29 DIAGNOSIS — M79671 Pain in right foot: Secondary | ICD-10-CM

## 2015-08-06 ENCOUNTER — Encounter: Payer: Self-pay | Admitting: Internal Medicine

## 2015-08-06 ENCOUNTER — Ambulatory Visit (INDEPENDENT_AMBULATORY_CARE_PROVIDER_SITE_OTHER): Payer: PRIVATE HEALTH INSURANCE | Admitting: Internal Medicine

## 2015-08-06 VITALS — BP 166/90 | HR 73 | Temp 98.2°F | Ht 65.0 in | Wt 243.4 lb

## 2015-08-06 DIAGNOSIS — Z862 Personal history of diseases of the blood and blood-forming organs and certain disorders involving the immune mechanism: Secondary | ICD-10-CM | POA: Diagnosis not present

## 2015-08-06 DIAGNOSIS — Z8742 Personal history of other diseases of the female genital tract: Secondary | ICD-10-CM

## 2015-08-06 DIAGNOSIS — N6452 Nipple discharge: Secondary | ICD-10-CM

## 2015-08-06 DIAGNOSIS — Z0000 Encounter for general adult medical examination without abnormal findings: Secondary | ICD-10-CM

## 2015-08-06 DIAGNOSIS — I10 Essential (primary) hypertension: Secondary | ICD-10-CM

## 2015-08-06 LAB — POCT GLYCOSYLATED HEMOGLOBIN (HGB A1C): Hemoglobin A1C: 5.8

## 2015-08-06 LAB — GLUCOSE, CAPILLARY: Glucose-Capillary: 95 mg/dL (ref 65–99)

## 2015-08-06 MED ORDER — CLONIDINE HCL 0.1 MG PO TABS: 0.1000 mg | ORAL_TABLET | Freq: Every day | ORAL | Status: AC

## 2015-08-06 MED ORDER — AMLODIPINE BESYLATE 10 MG PO TABS: 10.0000 mg | ORAL_TABLET | Freq: Every day | ORAL | Status: DC

## 2015-08-06 MED ORDER — LISINOPRIL-HYDROCHLOROTHIAZIDE 20-12.5 MG PO TABS: 1.0000 | ORAL_TABLET | Freq: Two times a day (BID) | ORAL | Status: AC

## 2015-08-06 NOTE — Assessment & Plan Note (Addendum)
-  stool cards given today (will need to mail) -declines all vaccines -mammogram ordered -eye exam completed last wk from Dr. Chinita Greenlandoop's office (will obtain records) -last pap [facility] (will obtain records-->app[facility])

## 2015-08-06 NOTE — Assessment & Plan Note (Signed)
BP today 166/90, HR 73.  She reports compliance with all BP meds.  She is taking lisinopril-HCTZ 20-12.5mg  bid and clonidine 0.2mg  bid.  She is overweight but tries to watch her salt intake.  FH of HTN in her father.   -check CMP, lipid panel -refilled lisinopril-HCTZ bid -added amlodipine 10mg  qd -decreased clonidine to 01mg  qd -f/u in ~2-3 wks -advised her to check blood pressures at home and reminded her the goal is <140/90 -DASH diet info provided

## 2015-08-06 NOTE — Progress Notes (Signed)
Patient ID: [name], female   DOB: 09-08-1955, 60 y.o.   MRN: [id number]     Subjective:   Patient ID: [name] female    DOB: 09-08-1955 60 y.o.    MRN: [id number] Health Maintenance Due: Health Maintenance Due  Topic Date Due  . Hepatitis C Screening  006[birth year]27-1957  . HIV Screening  08/04/1972  . TETANUS/TDAP  08/04/1976  . MAMMOGRAM  08/05/2007  . COLONOSCOPY  08/05/2007    _________________________________________________  HPI: [name] is a 60 y.o. female here to establish care.  [facility] but her insurance changed and she had to choose another provider.    She is also here to have an order placed for mammogram and hypertension mgmt.  Pt has a PMH outlined below.  Please see problem-based charting assessment and plan for further status of patient's chronic medical problems addressed at today's visit.  PMH: Past Medical History  Diagnosis Date  . Hypertension   . Anemia   . Hyperlipidemia "borderline"    Medications: No current outpatient prescriptions on file prior to visit.   No current facility-administered medications on file prior to visit.    Allergies: No Known Allergies  FH: Family History  Problem Relation Age of Onset  . Cancer Mother     breast  . Heart attack Mother   . Hypertension Father     SH: Social History   Social History  . Marital Status: Single    Spouse Name: N/A  . Number of Children: N/A  . Years of Education: N/A   Social History Main Topics  . Smoking status: Former Games developermoker  . Smokeless tobacco: Never Used     Comment: "only smoked for 2 years in high school"  . Alcohol Use: No  . Drug Use: No  . Sexual Activity: Not Asked   Other Topics Concern  . None   Social History Narrative    Review of Systems: Constitutional: Negative for fever, chills.  Eyes: Negative for blurred vision.  Respiratory: Negative for cough and shortness of breath.  Cardiovascular:  Negative for chest pain.  Gastrointestinal: Negative for nausea, vomiting. Neurological: Negative for dizziness.   Objective:   Vital Signs: Filed Vitals:   08/06/15 1520  BP: 166/90  Pulse: 73  Temp: 98.2 F (36.8 C)  TempSrc: Oral  Height: 5\' 5"  (1.651 m)  Weight: 243 lb 6.4 oz (110.406 kg)  SpO2: 97%      BP Readings from Last 3 Encounters:  08/06/15 166/90  11/07/12 132/84    Physical Exam: Constitutional: Vital signs reviewed.  Patient is in NAD and cooperative with exam.  Head: Normocephalic and atraumatic. Eyes: EOMI, conjunctivae nl, no scleral icterus.  Neck: Supple. Cardiovascular: RRR, no MRG. Pulmonary/Chest: normal effort, CTAB, no wheezes, rales, or rhonchi. Abdominal: Soft. NT/ND +BS. Neurological: A&O x3, cranial nerves II-XII are grossly intact, moving all extremities. Extremities: 2+DP b/l; Trace LE edema.  Skin: Warm, dry and intact. No rash.   Assessment & Plan:   Assessment and plan was discussed and formulated with my attending.

## 2015-08-06 NOTE — Patient Instructions (Signed)
Thank you for your visit today.   Please return to the internal medicine clinic in about 2-3 weeks or sooner if needed.     I have made the following additions/changes to your medications:  I have refilled your lisinopril and hydrochlorothiazide.  Please take this twice daily. I have also started you on amlodipine for blood pressure.  Please take this once daily. I will also decrease your clonidine to 0.1mg  once daily.  We will try to decrease this further.   Please try to exercise and lose weight.  Also consume a low sodium 2000mg  per day of sodium. I will check labs and send your results to you. I would like you to do the stool cards. I will also place an order for a mammogram.   Please be sure to bring all of your medications with you to every visit; this includes herbal supplements, vitamins, eye drops, and any over-the-counter medications.   Should you have any questions regarding your medications and/or any new or worsening symptoms, please be sure to call the clinic at [phone].   If you believe that you are suffering from a life threatening condition or one that may result in the loss of limb or function, then you should call 911 and proceed to the nearest Emergency Department.   A healthy lifestyle and preventative care can promote health and wellness.   Maintain regular health, dental, and eye exams.  Eat a healthy diet. Foods like vegetables, fruits, whole grains, low-fat dairy products, and lean protein foods contain the nutrients you need without too many calories. Decrease your intake of foods high in solid fats, added sugars, and salt. Get information about a proper diet from your caregiver, if necessary.  Regular physical exercise is one of the most important things you can do for your health. Most adults should get at least 150 minutes of moderate-intensity exercise (any activity that increases your heart rate and causes you to sweat) each week. In addition, most  adults need muscle-strengthening exercises on 2 or more days a week.   Maintain a healthy weight. The body mass index (BMI) is a screening tool to identify possible weight problems. It provides an estimate of body fat based on height and weight. Your caregiver can help determine your BMI, and can help you achieve or maintain a healthy weight. For adults 20 years and older:  A BMI below 18.5 is considered underweight.  A BMI of 18.5 to 24.9 is normal.  A BMI of 25 to 29.9 is considered overweight.  A BMI of 30 and above is considered obese. DASH Eating Plan DASH stands for "Dietary Approaches to Stop Hypertension." The DASH eating plan is a healthy eating plan that has been shown to reduce high blood pressure (hypertension). Additional health benefits may include reducing the risk of type 2 diabetes mellitus, heart disease, and stroke. The DASH eating plan may also help with weight loss. WHAT DO I NEED TO KNOW ABOUT THE DASH EATING PLAN? For the DASH eating plan, you will follow these general guidelines:  Choose foods with a percent daily value for sodium of less than 5% (as listed on the food label).  Use salt-free seasonings or herbs instead of table salt or sea salt.  Check with your health care provider or pharmacist before using salt substitutes.  Eat lower-sodium products, often labeled as "lower sodium" or "no salt added."  Eat fresh foods.  Eat more vegetables, fruits, and low-fat dairy products.  Choose whole grains. Look  for the word "whole" as the first word in the ingredient list.  Choose fish and skinless chicken or [place] more often than red meat. Limit fish, poultry, and meat to 6 oz (170 g) each day.  Limit sweets, desserts, sugars, and sugary drinks.  Choose heart-healthy fats.  Limit cheese to 1 oz (28 g) per day.  Eat more home-cooked food and less restaurant, buffet, and fast food.  Limit fried foods.  Cook foods using methods other than frying.  Limit  canned vegetables. If you do use them, rinse them well to decrease the sodium.  When eating at a restaurant, ask that your food be prepared with less salt, or no salt if possible. WHAT FOODS CAN I EAT? Seek help from a dietitian for individual calorie needs. Grains Whole grain or whole wheat bread. Brown rice. Whole grain or whole wheat pasta. Quinoa, bulgur, and whole grain cereals. Low-sodium cereals. Corn or whole wheat flour tortillas. Whole grain cornbread. Whole grain crackers. Low-sodium crackers. Vegetables Fresh or frozen vegetables (raw, steamed, roasted, or grilled). Low-sodium or reduced-sodium tomato and vegetable juices. Low-sodium or reduced-sodium tomato sauce and paste. Low-sodium or reduced-sodium canned vegetables.  Fruits All fresh, canned (in natural juice), or frozen fruits. Meat and Other Protein Products Ground beef (85% or leaner), grass-fed beef, or beef trimmed of fat. Skinless chicken or [place]. Ground chicken or [place]. Pork trimmed of fat. All fish and seafood. Eggs. Dried beans, peas, or lentils. Unsalted nuts and seeds. Unsalted canned beans. Dairy Low-fat dairy products, such as skim or 1% milk, 2% or reduced-fat cheeses, low-fat ricotta or cottage cheese, or plain low-fat yogurt. Low-sodium or reduced-sodium cheeses. Fats and Oils Tub margarines without trans fats. Light or reduced-fat mayonnaise and salad dressings (reduced sodium). Avocado. Safflower, olive, or canola oils. Natural peanut or almond butter. Other Unsalted popcorn and pretzels. The items listed above may not be a complete list of recommended foods or beverages. Contact your dietitian for more options. WHAT FOODS ARE NOT RECOMMENDED? Grains White bread. White pasta. White rice. Refined cornbread. Bagels and croissants. Crackers that contain trans fat. Vegetables Creamed or fried vegetables. Vegetables in a cheese sauce. Regular canned vegetables. Regular canned tomato sauce and paste. Regular  tomato and vegetable juices. Fruits Dried fruits. Canned fruit in light or heavy syrup. Fruit juice. Meat and Other Protein Products Fatty cuts of meat. Ribs, chicken wings, bacon, sausage, bologna, salami, chitterlings, fatback, hot dogs, bratwurst, and packaged luncheon meats. Salted nuts and seeds. Canned beans with salt. Dairy Whole or 2% milk, cream, half-and-half, and cream cheese. Whole-fat or sweetened yogurt. Full-fat cheeses or blue cheese. Nondairy creamers and whipped toppings. Processed cheese, cheese spreads, or cheese curds. Condiments Onion and garlic salt, seasoned salt, table salt, and sea salt. Canned and packaged gravies. Worcestershire sauce. Tartar sauce. Barbecue sauce. Teriyaki sauce. Soy sauce, including reduced sodium. Steak sauce. Fish sauce. Oyster sauce. Cocktail sauce. Horseradish. Ketchup and mustard. Meat flavorings and tenderizers. Bouillon cubes. Hot sauce. Tabasco sauce. Marinades. Taco seasonings. Relishes. Fats and Oils Butter, stick margarine, lard, shortening, ghee, and bacon fat. Coconut, palm kernel, or palm oils. Regular salad dressings. Other Pickles and olives. Salted popcorn and pretzels. The items listed above may not be a complete list of foods and beverages to avoid. Contact your dietitian for more information. WHERE CAN I FIND MORE INFORMATION? National Heart, Lung, and Blood Institute: [employer]   This information is not intended to replace advice given to you by your health care provider. Make sure  you discuss any questions you have with your health care provider.   Document Released: 02/18/2011 Document Revised: 03/22/2014 Document Reviewed: 01/03/2013 Elsevier Interactive Patient Education [employer]2016 Elsevier Inc.

## 2015-08-06 NOTE — Assessment & Plan Note (Signed)
Pt has a h/o anemia of unknown origin.  She reports a h/o menorrhagia.  She has never had a colonoscopy but did use stool cards in the past. -check cbc/diff -stool cards will be mailed

## 2015-08-06 NOTE — Assessment & Plan Note (Signed)
Nipple discharge has resolved. -mammogram ordered today

## 2015-08-07 ENCOUNTER — Encounter: Payer: Self-pay | Admitting: Internal Medicine

## 2015-08-07 LAB — CBC WITH DIFFERENTIAL/PLATELET
BASOS ABS: 0 10*3/uL (ref 0.0–0.2)
Basos: 0 %
EOS (ABSOLUTE): 0.1 10*3/uL (ref 0.0–0.4)
Eos: 1 %
Hematocrit: 37.6 % (ref 34.0–46.6)
Hemoglobin: 12.7 g/dL (ref 11.1–15.9)
IMMATURE GRANS (ABS): 0 10*3/uL (ref 0.0–0.1)
Immature Granulocytes: 0 %
LYMPHS ABS: 3.8 10*3/uL — AB (ref 0.7–3.1)
LYMPHS: 32 %
MCH: 28.1 pg (ref 26.6–33.0)
MCHC: 33.8 g/dL (ref 31.5–35.7)
MCV: 83 fL (ref 79–97)
MONOS ABS: 0.6 10*3/uL (ref 0.1–0.9)
Monocytes: 5 %
NEUTROS ABS: 7.2 10*3/uL — AB (ref 1.4–7.0)
Neutrophils: 62 %
PLATELETS: 355 10*3/uL (ref 150–379)
RBC: 4.52 x10E6/uL (ref 3.77–5.28)
RDW: 15 % (ref 12.3–15.4)
WBC: 11.8 10*3/uL — ABNORMAL HIGH (ref 3.4–10.8)

## 2015-08-07 LAB — CMP14 + ANION GAP
ALBUMIN: 4.3 g/dL (ref 3.5–5.5)
ALK PHOS: 82 IU/L (ref 39–117)
ALT: 17 IU/L (ref 0–32)
ANION GAP: 15 mmol/L (ref 10.0–18.0)
AST: 18 IU/L (ref 0–40)
Albumin/Globulin Ratio: 1.6 (ref 1.2–2.2)
BILIRUBIN TOTAL: 0.2 mg/dL (ref 0.0–1.2)
BUN / CREAT RATIO: 25 — AB (ref 9–23)
BUN: 13 mg/dL (ref 6–24)
CO2: 27 mmol/L (ref 18–29)
CREATININE: 0.52 mg/dL — AB (ref 0.57–1.00)
Calcium: 9.4 mg/dL (ref 8.7–10.2)
Chloride: 98 mmol/L (ref 96–106)
GFR calc non Af Amer: 106 mL/min/{1.73_m2} (ref >59)
GFR, EST AFRICAN AMERICAN: 122 mL/min/{1.73_m2} (ref >59)
GLOBULIN, TOTAL: 2.7 g/dL (ref 1.5–4.5)
Glucose: 101 mg/dL — ABNORMAL HIGH (ref 65–99)
Potassium: 3.7 mmol/L (ref 3.5–5.2)
SODIUM: 140 mmol/L (ref 134–144)
TOTAL PROTEIN: 7 g/dL (ref 6.0–8.5)

## 2015-08-07 LAB — LIPID PANEL
CHOL/HDL RATIO: 4.3 ratio (ref 0.0–4.4)
CHOLESTEROL TOTAL: 191 mg/dL (ref 100–199)
HDL: 44 mg/dL (ref >39)
LDL CALC: 134 mg/dL — AB (ref 0–99)
TRIGLYCERIDES: 67 mg/dL (ref 0–149)
VLDL Cholesterol Cal: 13 mg/dL (ref 5–40)

## 2015-08-08 NOTE — Progress Notes (Signed)
Internal Medicine Clinic Attending  Case discussed with Dr. Gill at the time of the visit.  We reviewed the resident's history and exam and pertinent patient test results.  I agree with the assessment, diagnosis, and plan of care documented in the resident's note.  

## 2015-08-19 NOTE — Addendum Note (Signed)
Addended by: Marrian SalvageGILL, Rikki Smestad S on: 08/19/2015 07:14 AM   Modules accepted: Level of Service

## 2015-08-27 ENCOUNTER — Ambulatory Visit: Payer: PRIVATE HEALTH INSURANCE | Admitting: Internal Medicine

## 2016-06-24 ENCOUNTER — Telehealth: Payer: Self-pay | Admitting: Internal Medicine

## 2016-06-24 NOTE — Telephone Encounter (Signed)
Calling to confirm appointment for 06/25/16 at 3:45 lmtcb ° °

## 2016-06-25 ENCOUNTER — Encounter: Payer: PRIVATE HEALTH INSURANCE | Admitting: Internal Medicine

## 2016-09-05 ENCOUNTER — Emergency Department (HOSPITAL_COMMUNITY)
Admission: EM | Admit: 2016-09-05 | Discharge: 2016-09-05 | Disposition: A | Discharge status: Home / Self Care | Payer: No Typology Code available for payment source | Attending: Emergency Medicine | Admitting: Emergency Medicine

## 2016-09-05 ENCOUNTER — Encounter (HOSPITAL_COMMUNITY): Payer: Self-pay

## 2016-09-05 DIAGNOSIS — Y92410 Unspecified street and highway as the place of occurrence of the external cause: Secondary | ICD-10-CM | POA: Insufficient documentation

## 2016-09-05 DIAGNOSIS — Z79899 Other long term (current) drug therapy: Secondary | ICD-10-CM | POA: Diagnosis not present

## 2016-09-05 DIAGNOSIS — Y999 Unspecified external cause status: Secondary | ICD-10-CM | POA: Insufficient documentation

## 2016-09-05 DIAGNOSIS — I10 Essential (primary) hypertension: Secondary | ICD-10-CM | POA: Diagnosis not present

## 2016-09-05 DIAGNOSIS — M549 Dorsalgia, unspecified: Secondary | ICD-10-CM | POA: Insufficient documentation

## 2016-09-05 DIAGNOSIS — Z87891 Personal history of nicotine dependence: Secondary | ICD-10-CM | POA: Insufficient documentation

## 2016-09-05 DIAGNOSIS — Y939 Activity, unspecified: Secondary | ICD-10-CM | POA: Diagnosis not present

## 2016-09-05 MED ORDER — CYCLOBENZAPRINE HCL 10 MG PO TABS: 10.0000 mg | ORAL_TABLET | Freq: Two times a day (BID) | ORAL | 0 refills | Status: DC | PRN

## 2016-09-05 MED ORDER — IBUPROFEN 400 MG PO TABS: 400.0000 mg | ORAL_TABLET | Freq: Three times a day (TID) | ORAL | 0 refills | Status: AC
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
## 2016-12-09 ENCOUNTER — Other Ambulatory Visit: Payer: Self-pay | Admitting: Family Medicine

## 2016-12-09 DIAGNOSIS — Z1231 Encounter for screening mammogram for malignant neoplasm of breast: Secondary | ICD-10-CM

## 2017-01-11 ENCOUNTER — Ambulatory Visit: Payer: PRIVATE HEALTH INSURANCE

## 2019-02-27 ENCOUNTER — Ambulatory Visit: Payer: PRIVATE HEALTH INSURANCE | Admitting: Family Medicine

## 2019-02-27 VITALS — BP 112/78 | Resp 63

## 2019-02-27 DIAGNOSIS — I10 Essential (primary) hypertension: Secondary | ICD-10-CM

## 2019-02-27 NOTE — Progress Notes (Signed)
  Subjective:     Patient ID: [name], female   DOB: 07-09-1955, 63 y.o.   MRN: [id number]  HPI [name] presents to the [facility] today for her wellness visit required by her insurance. She sees Dr. Theda Sers as her PCP and last saw her in August 2020, lab work was done and normal per patient. We reviewed her PMH and wellness needs discussed.  [name] has a hx of HTN per patient it is well controlled. She continues her lisinopril-HCTZ and clonidine daily. Denies any problems with this. She has recently lost about 35 lbs using a weight loss program where she eats 6 times a day. States this is working well for her.   Health maintenance: Mammogram was completed this year and normal. Colonoscopy was completed this year and normal per pt. States last pap smear was 2 years ago and normal, no hx of abnormals. She declines flu vaccine.    Review of Systems  Constitutional: Negative for chills and fever.  Respiratory: Negative for cough, chest tightness and shortness of breath.   Cardiovascular: Negative for chest pain.  Neurological: Negative for dizziness and headaches.       Objective:   Physical Exam Vitals reviewed.  Constitutional:      General: She is not in acute distress.    Appearance: Normal appearance. She is not toxic-appearing.  HENT:     Head: Normocephalic and atraumatic.  Cardiovascular:     Rate and Rhythm: Normal rate and regular rhythm.     Heart sounds: Normal heart sounds.  Pulmonary:     Effort: Pulmonary effort is normal. No respiratory distress.     Breath sounds: Normal breath sounds.  Skin:    General: Skin is warm and dry.  Neurological:     Mental Status: She is alert and oriented to person, place, and time.  Psychiatric:        Mood and Affect: Mood normal.        Behavior: Behavior normal.        Assessment:     Essential hypertension      Plan:     1. HTN is well controlled. Continue current medication regimen. Keep all f/u appointments with  PCP. Continue efforts at weight loss with healthy eating and exercise. F/u here prn.

## 2020-11-25 ENCOUNTER — Ambulatory Visit: Payer: PRIVATE HEALTH INSURANCE | Admitting: Family Medicine

## 2020-11-25 VITALS — BP 122/78 | HR 58 | Ht 65.0 in | Wt 170.0 lb

## 2020-11-25 DIAGNOSIS — Z789 Other specified health status: Secondary | ICD-10-CM

## 2020-11-25 NOTE — Progress Notes (Signed)
  Subjective:     Patient ID: [name], female   DOB: 1956-02-17, 65 y.o.   MRN: [id number]  HPI [name] for her required wellness visit. Her PCP is Irena Reichmann, she saw earlier this year. Reports cologuard done a couple years ago. Mammogram done last year, scheduled for next month. Pap smear is UTD. She denies any problems or concerns.   Past Medical History:  Diagnosis Date   Anemia    Hyperlipidemia "borderline"   Hypertension    No Known Allergies  Current Outpatient Medications:    cloNIDine (CATAPRES) 0.1 MG tablet, Take 1 tablet (0.1 mg total) by mouth daily., Disp: 30 tablet, Rfl: 0   lisinopril-hydrochlorothiazide (PRINZIDE,ZESTORETIC) 20-12.5 MG tablet, Take 1 tablet by mouth 2 (two) times daily., Disp: 60 tablet, Rfl: 1   Multiple Vitamins-Minerals (ZINC PO), Take by mouth., Disp: , Rfl:    VITAMIN D, CHOLECALCIFEROL, PO, Take by mouth., Disp: , Rfl:   Review of Systems  Constitutional:  Negative for chills, fatigue, fever and unexpected weight change.  HENT:  Negative for congestion, ear pain, sinus pressure, sinus pain and sore throat.   Eyes:  Negative for discharge and visual disturbance.  Respiratory:  Negative for cough, shortness of breath and wheezing.   Cardiovascular:  Negative for chest pain and leg swelling.  Gastrointestinal:  Negative for abdominal pain, blood in stool, constipation, diarrhea, nausea and vomiting.  Genitourinary:  Negative for difficulty urinating and hematuria.  Skin:  Negative for color change.  Neurological:  Negative for dizziness, weakness, light-headedness and headaches.  Hematological:  Negative for adenopathy.  All other systems reviewed and are negative.     Objective:   Physical Exam Vitals reviewed.  Constitutional:      General: She is not in acute distress.    Appearance: Normal appearance. She is well-developed.  HENT:     Head: Normocephalic and atraumatic.     Nose: No  congestion.  Eyes:     General:        Right eye: No discharge.        Left eye: No discharge.  Cardiovascular:     Rate and Rhythm: Normal rate and regular rhythm.     Heart sounds: Normal heart sounds.  Pulmonary:     Effort: Pulmonary effort is normal. No respiratory distress.     Breath sounds: Normal breath sounds.  Musculoskeletal:     Cervical back: Neck supple.  Skin:    General: Skin is warm and dry.  Neurological:     Mental Status: She is alert and oriented to person, place, and time.  Psychiatric:        Mood and Affect: Mood normal.        Behavior: Behavior normal.   Today's Vitals   11/25/20 1132  BP: 122/78  Pulse: (!) 58  SpO2: 99%  Weight: 170 lb (77.1 kg)  Height: 5\' 5"  (1.651 m)   Body mass index is 28.29 kg/m.     Assessment:     Participant in health and wellness plan     Plan:     Keep all regular appts with PCP. Continue efforts at healthy diet and exercise. F/u here prn.

## 2021-03-17 ENCOUNTER — Other Ambulatory Visit: Payer: Self-pay | Admitting: Radiology

## 2021-12-01 ENCOUNTER — Encounter: Payer: Self-pay | Admitting: Nurse Practitioner

## 2021-12-01 ENCOUNTER — Ambulatory Visit: Payer: PRIVATE HEALTH INSURANCE | Admitting: Nurse Practitioner

## 2021-12-01 VITALS — BP 105/70 | HR 58

## 2021-12-01 DIAGNOSIS — Z789 Other specified health status: Secondary | ICD-10-CM

## 2021-12-01 NOTE — Progress Notes (Signed)
Office Visit  Subjective:  Patient ID: [name], female    DOB: [birth year]  Age: 66 y.o. MRN: [id number]  CC: Wellness exam  HPI NONIE LOCHNER presents for wellness exam visit for insurance benefit.  Patient has a PCP: Dr. Theda Sers at [facility]  Last physical was this month.  PMH significant for: HTN Last labs per PCP were completed: Sept 2023  Health Maintenance:  Colonoscopy: states she does cologuard and its on its way to her.  Mammogram: Oct 2022    Smoker: former (only smoked 2 years in high school)  Immunizations:  Shingrix-  deferred COVID- x 1 (moderna) Tdap- reports up to date    Lifestyle: Diet- does not eat meat. Avoids diary. Tries to stick to a vegan diet. Reports she has lost more than 60 pounds in the last 2-3 years.  Exercise- follows-up with a personal trainer 2 times a week.      Past Medical History:  Diagnosis Date   Anemia    Hyperlipidemia "borderline"   Hypertension     Past Surgical History:  Procedure Laterality Date   APPENDECTOMY     CESAREAN SECTION      Outpatient Medications Prior to Visit  Medication Sig Dispense Refill   cloNIDine (CATAPRES) 0.1 MG tablet Take 1 tablet (0.1 mg total) by mouth daily. 30 tablet 0   lisinopril-hydrochlorothiazide (PRINZIDE,ZESTORETIC) 20-12.5 MG tablet Take 1 tablet by mouth 2 (two) times daily. 60 tablet 1   Multiple Vitamins-Minerals (ZINC PO) Take by mouth.     VITAMIN D, CHOLECALCIFEROL, PO Take by mouth.     No facility-administered medications prior to visit.    ROS Review of Systems  Respiratory:  Negative for shortness of breath.   Cardiovascular:  Negative for chest pain and leg swelling.  Gastrointestinal:  Negative for constipation and diarrhea.  Neurological:  Negative for dizziness and headaches.    Objective:  BP 105/70   Pulse (!) 58   SpO2 98%   Physical Exam Constitutional:      General: She is not in acute distress. HENT:     Head:  Normocephalic.  Pulmonary:     Effort: Pulmonary effort is normal.  Musculoskeletal:        General: Normal range of motion.  Skin:    General: Skin is warm.  Neurological:     General: No focal deficit present.     Mental Status: She is alert and oriented to person, place, and time.  Psychiatric:        Mood and Affect: Mood normal.        Behavior: Behavior normal.      Assessment & Plan:    Tasfia was seen today for wellness exam.  Diagnoses and all orders for this visit:  Participant in health and wellness plan Adult wellness physical was conducted today. Importance of diet and exercise were discussed in detail. Congratulated on efforts. We reviewed immunizations and gave recommendations regarding current immunization needed for age. Dicussed Shingrix, patient states she will think about this.  Preventative health exams needed: Colonoscopy, patient has cologuard ordered.    Patient was advised yearly wellness exam and follow-up with PCP as scheduled.     No orders of the defined types were placed in this encounter.   No orders of the defined types were placed in this encounter.   Follow-up: as needed.

## 2021-12-14 LAB — COLOGUARD: COLOGUARD: NEGATIVE

## 2021-12-14 LAB — EXTERNAL GENERIC LAB PROCEDURE: COLOGUARD: NEGATIVE

## 2022-03-31 ENCOUNTER — Other Ambulatory Visit: Payer: Self-pay | Admitting: Family Medicine

## 2022-03-31 DIAGNOSIS — M5417 Radiculopathy, lumbosacral region: Secondary | ICD-10-CM

## 2022-03-31 DIAGNOSIS — M47816 Spondylosis without myelopathy or radiculopathy, lumbar region: Secondary | ICD-10-CM

## 2022-04-23 ENCOUNTER — Ambulatory Visit
Admission: RE | Admit: 2022-04-23 | Discharge: 2022-04-23 | Disposition: A | Discharge status: Home / Self Care | Payer: PRIVATE HEALTH INSURANCE | Source: Ambulatory Visit | Attending: Family Medicine | Admitting: Family Medicine

## 2022-04-23 DIAGNOSIS — M5417 Radiculopathy, lumbosacral region: Secondary | ICD-10-CM

## 2022-04-23 DIAGNOSIS — M47816 Spondylosis without myelopathy or radiculopathy, lumbar region: Secondary | ICD-10-CM

## 2022-09-07 DIAGNOSIS — H40013 Open angle with borderline findings, low risk, bilateral: Secondary | ICD-10-CM | POA: Diagnosis not present

## 2022-09-07 DIAGNOSIS — H2513 Age-related nuclear cataract, bilateral: Secondary | ICD-10-CM | POA: Diagnosis not present

## 2023-04-08 DIAGNOSIS — Z1231 Encounter for screening mammogram for malignant neoplasm of breast: Secondary | ICD-10-CM | POA: Diagnosis not present

## 2023-04-26 DIAGNOSIS — R0789 Other chest pain: Secondary | ICD-10-CM | POA: Diagnosis not present

## 2023-04-26 DIAGNOSIS — R519 Headache, unspecified: Secondary | ICD-10-CM | POA: Diagnosis not present

## 2023-04-26 DIAGNOSIS — R002 Palpitations: Secondary | ICD-10-CM | POA: Diagnosis not present

## 2023-04-26 DIAGNOSIS — F439 Reaction to severe stress, unspecified: Secondary | ICD-10-CM | POA: Diagnosis not present

## 2023-05-13 ENCOUNTER — Other Ambulatory Visit: Payer: Self-pay | Admitting: Family Medicine

## 2023-05-13 DIAGNOSIS — R531 Weakness: Secondary | ICD-10-CM

## 2023-05-13 DIAGNOSIS — R519 Headache, unspecified: Secondary | ICD-10-CM

## 2023-05-13 DIAGNOSIS — I10 Essential (primary) hypertension: Secondary | ICD-10-CM

## 2023-05-13 DIAGNOSIS — R002 Palpitations: Secondary | ICD-10-CM

## 2023-06-12 ENCOUNTER — Ambulatory Visit
Admission: RE | Admit: 2023-06-12 | Discharge: 2023-06-12 | Disposition: A | Discharge status: Home / Self Care | Payer: PRIVATE HEALTH INSURANCE | Source: Ambulatory Visit | Attending: Family Medicine | Admitting: Family Medicine

## 2023-06-12 ENCOUNTER — Other Ambulatory Visit: Payer: Self-pay | Admitting: Family Medicine

## 2023-06-12 DIAGNOSIS — R002 Palpitations: Secondary | ICD-10-CM

## 2023-06-12 DIAGNOSIS — R519 Headache, unspecified: Secondary | ICD-10-CM

## 2023-06-12 DIAGNOSIS — R531 Weakness: Secondary | ICD-10-CM

## 2023-06-12 DIAGNOSIS — I10 Essential (primary) hypertension: Secondary | ICD-10-CM

## 2023-07-26 ENCOUNTER — Ambulatory Visit: Admitting: Nurse Practitioner

## 2023-07-26 ENCOUNTER — Encounter: Payer: Self-pay | Admitting: Nurse Practitioner

## 2023-07-26 VITALS — BP 118/80 | HR 62

## 2023-07-26 DIAGNOSIS — Z789 Other specified health status: Secondary | ICD-10-CM

## 2023-07-26 NOTE — Progress Notes (Signed)
 Occupational Health- [facility]  Subjective:  Patient ID: [name], female    DOB: 04-23-1955  Age: 68 y.o. MRN: [id number]  CC: Wellness exam   HPI [name] presents for wellness exam visit for insurance benefit.  Patient has a PCP: Dr. Hazeline Lister at [facility].  PMH significant for: HTN Last labs per PCP were completed: August 2024  Health Maintenance:  Colonoscopy: cologuard in 2023-was negative Mammogram: Jan 2025- negative.  Pap: has been several years ago.     Smoker: only smoked in high school for 2 years.  Immunizations:  Shingrix- declined Flu- does not get yearly  Tdap- reports up to date.   Lifestyle: Diet- plant-based Exercise- works out 2-3 a week. Weightlifting     Past Medical History:  Diagnosis Date   Anemia    Hyperlipidemia "borderline"   Hypertension     Past Surgical History:  Procedure Laterality Date   APPENDECTOMY     CESAREAN SECTION      Outpatient Medications Prior to Visit  Medication Sig Dispense Refill   cloNIDine  (CATAPRES ) 0.1 MG tablet Take 1 tablet (0.1 mg total) by mouth daily. 30 tablet 0   lisinopril -hydrochlorothiazide  (PRINZIDE ,ZESTORETIC ) 20-12.5 MG tablet Take 1 tablet by mouth 2 (two) times daily. 60 tablet 1   Multiple Vitamins-Minerals (ZINC PO) Take by mouth.     VITAMIN D, CHOLECALCIFEROL, PO Take by mouth.     No facility-administered medications prior to visit.    ROS Review of Systems  HENT:  Negative for hearing loss.   Eyes:  Negative for visual disturbance.  Respiratory:  Negative for shortness of breath.   Cardiovascular:  Negative for chest pain.  Gastrointestinal:  Negative for constipation and diarrhea.  Musculoskeletal: Negative.   Neurological:  Negative for headaches.  Psychiatric/Behavioral:  Negative for sleep disturbance.     Objective:  BP 118/80 (BP Location: Right Arm, Patient Position: Sitting, Cuff Size: Normal)   Pulse 62   SpO2 100%   Physical  Exam Constitutional:      General: She is not in acute distress. HENT:     Head: Normocephalic.     Right Ear: Tympanic membrane, ear canal and external ear normal.     Left Ear: Tympanic membrane, ear canal and external ear normal.     Mouth/Throat:     Mouth: Mucous membranes are moist.     Pharynx: No oropharyngeal exudate or posterior oropharyngeal erythema.  Eyes:     Pupils: Pupils are equal, round, and reactive to light.  Cardiovascular:     Rate and Rhythm: Normal rate and regular rhythm.     Heart sounds: Normal heart sounds.  Pulmonary:     Effort: Pulmonary effort is normal.     Breath sounds: Normal breath sounds.  Musculoskeletal:        General: Normal range of motion.  Skin:    General: Skin is warm.  Neurological:     General: No focal deficit present.     Mental Status: She is alert and oriented to person, place, and time.  Psychiatric:        Mood and Affect: Mood normal.        Behavior: Behavior normal.      Assessment & Plan:    [name] was seen today for wellness exam.  Diagnoses and all orders for this visit:  Participant in health and wellness plan Adult wellness physical was conducted today. Importance of diet and exercise were discussed in detail.  We reviewed immunizations and gave recommendations regarding current immunization needed for age.  Preventative health exams are up to date.   Patient was advised yearly wellness exam    No orders of the defined types were placed in this encounter.   No orders of the defined types were placed in this encounter.   Follow-up: as needed.
